# Patient Record
Sex: Female | Born: 1965 | Race: White | Hispanic: No | Smoking: Current every day smoker
Health system: Southern US, Community
[De-identification: ages and names within clinical notes are randomized; demographics above are authoritative.]

## PROBLEM LIST (undated history)

## (undated) DIAGNOSIS — M5030 Other cervical disc degeneration, unspecified cervical region: Secondary | ICD-10-CM

## (undated) DIAGNOSIS — I219 Acute myocardial infarction, unspecified: Secondary | ICD-10-CM

## (undated) DIAGNOSIS — F1290 Cannabis use, unspecified, uncomplicated: Secondary | ICD-10-CM

## (undated) DIAGNOSIS — E785 Hyperlipidemia, unspecified: Secondary | ICD-10-CM

## (undated) DIAGNOSIS — I779 Disorder of arteries and arterioles, unspecified: Secondary | ICD-10-CM

## (undated) DIAGNOSIS — Z720 Tobacco use: Secondary | ICD-10-CM

## (undated) DIAGNOSIS — Z87442 Personal history of urinary calculi: Secondary | ICD-10-CM

## (undated) DIAGNOSIS — M62838 Other muscle spasm: Secondary | ICD-10-CM

## (undated) DIAGNOSIS — I2510 Atherosclerotic heart disease of native coronary artery without angina pectoris: Secondary | ICD-10-CM

## (undated) DIAGNOSIS — I700 Atherosclerosis of aorta: Secondary | ICD-10-CM

## (undated) DIAGNOSIS — E559 Vitamin D deficiency, unspecified: Secondary | ICD-10-CM

## (undated) DIAGNOSIS — G4730 Sleep apnea, unspecified: Secondary | ICD-10-CM

## (undated) DIAGNOSIS — I10 Essential (primary) hypertension: Secondary | ICD-10-CM

## (undated) DIAGNOSIS — Z8616 Personal history of COVID-19: Secondary | ICD-10-CM

## (undated) DIAGNOSIS — R911 Solitary pulmonary nodule: Secondary | ICD-10-CM

## (undated) DIAGNOSIS — Z7902 Long term (current) use of antithrombotics/antiplatelets: Secondary | ICD-10-CM

## (undated) DIAGNOSIS — M419 Scoliosis, unspecified: Secondary | ICD-10-CM

## (undated) DIAGNOSIS — J449 Chronic obstructive pulmonary disease, unspecified: Secondary | ICD-10-CM

## (undated) DIAGNOSIS — R0600 Dyspnea, unspecified: Secondary | ICD-10-CM

## (undated) DIAGNOSIS — F419 Anxiety disorder, unspecified: Secondary | ICD-10-CM

## (undated) HISTORY — PX: KYPHOPLASTY: SHX5884

## (undated) HISTORY — PX: BACK SURGERY: SHX140

---

## 1998-12-25 ENCOUNTER — Other Ambulatory Visit: Admission: RE | Admit: 1998-12-25 | Discharge: 1998-12-25 | Payer: Self-pay | Admitting: *Deleted

## 1999-06-23 ENCOUNTER — Other Ambulatory Visit: Admission: RE | Admit: 1999-06-23 | Discharge: 1999-06-23 | Payer: Self-pay | Admitting: *Deleted

## 1999-07-21 ENCOUNTER — Other Ambulatory Visit: Admission: RE | Admit: 1999-07-21 | Discharge: 1999-07-21 | Payer: Self-pay | Admitting: *Deleted

## 2000-01-28 ENCOUNTER — Inpatient Hospital Stay (HOSPITAL_COMMUNITY): Admission: AD | Admit: 2000-01-28 | Discharge: 2000-01-28 | Payer: Self-pay | Admitting: Obstetrics

## 2000-02-17 ENCOUNTER — Other Ambulatory Visit: Admission: RE | Admit: 2000-02-17 | Discharge: 2000-02-17 | Payer: Self-pay | Admitting: Obstetrics & Gynecology

## 2000-07-28 ENCOUNTER — Inpatient Hospital Stay (HOSPITAL_COMMUNITY): Admission: RE | Admit: 2000-07-28 | Discharge: 2000-07-28 | Payer: Self-pay | Admitting: Obstetrics & Gynecology

## 2000-07-28 ENCOUNTER — Encounter: Payer: Self-pay | Admitting: Obstetrics & Gynecology

## 2000-08-14 ENCOUNTER — Inpatient Hospital Stay (HOSPITAL_COMMUNITY): Admission: AD | Admit: 2000-08-14 | Discharge: 2000-08-14 | Payer: Self-pay | Admitting: Obstetrics & Gynecology

## 2000-08-14 ENCOUNTER — Encounter: Payer: Self-pay | Admitting: Obstetrics & Gynecology

## 2000-08-25 ENCOUNTER — Inpatient Hospital Stay (HOSPITAL_COMMUNITY): Admission: AD | Admit: 2000-08-25 | Discharge: 2000-08-25 | Payer: Self-pay | Admitting: Obstetrics and Gynecology

## 2000-08-25 ENCOUNTER — Encounter: Payer: Self-pay | Admitting: Obstetrics and Gynecology

## 2000-08-28 ENCOUNTER — Inpatient Hospital Stay (HOSPITAL_COMMUNITY): Admission: AD | Admit: 2000-08-28 | Discharge: 2000-08-28 | Payer: Self-pay | Admitting: Obstetrics & Gynecology

## 2000-08-28 ENCOUNTER — Encounter: Payer: Self-pay | Admitting: Obstetrics & Gynecology

## 2000-08-30 ENCOUNTER — Inpatient Hospital Stay (HOSPITAL_COMMUNITY): Admission: AD | Admit: 2000-08-30 | Discharge: 2000-08-30 | Payer: Self-pay | Admitting: Obstetrics and Gynecology

## 2000-09-01 ENCOUNTER — Inpatient Hospital Stay (HOSPITAL_COMMUNITY): Admission: AD | Admit: 2000-09-01 | Discharge: 2000-09-04 | Payer: Self-pay | Admitting: Obstetrics & Gynecology

## 2000-09-06 ENCOUNTER — Inpatient Hospital Stay (HOSPITAL_COMMUNITY): Admission: AD | Admit: 2000-09-06 | Discharge: 2000-09-06 | Payer: Self-pay | Admitting: Obstetrics and Gynecology

## 2000-09-26 ENCOUNTER — Other Ambulatory Visit: Admission: RE | Admit: 2000-09-26 | Discharge: 2000-09-26 | Payer: Self-pay | Admitting: Obstetrics & Gynecology

## 2004-12-11 ENCOUNTER — Inpatient Hospital Stay (HOSPITAL_COMMUNITY): Admission: AD | Admit: 2004-12-11 | Discharge: 2004-12-11 | Payer: Self-pay | Admitting: Gynecology

## 2004-12-22 ENCOUNTER — Encounter: Payer: Self-pay | Admitting: Gynecology

## 2004-12-27 ENCOUNTER — Ambulatory Visit (HOSPITAL_COMMUNITY): Admission: RE | Admit: 2004-12-27 | Discharge: 2004-12-27 | Payer: Self-pay | Admitting: Gynecology

## 2005-09-02 ENCOUNTER — Ambulatory Visit (HOSPITAL_COMMUNITY): Admission: RE | Admit: 2005-09-02 | Discharge: 2005-09-02 | Payer: Self-pay | Admitting: Gynecology

## 2005-10-28 ENCOUNTER — Inpatient Hospital Stay (HOSPITAL_COMMUNITY): Admission: AD | Admit: 2005-10-28 | Discharge: 2005-10-29 | Payer: Self-pay | Admitting: Gynecology

## 2005-10-29 ENCOUNTER — Observation Stay (HOSPITAL_COMMUNITY): Admission: AD | Admit: 2005-10-29 | Discharge: 2005-10-30 | Payer: Self-pay | Admitting: Gynecology

## 2005-12-15 ENCOUNTER — Emergency Department: Payer: Self-pay | Admitting: Emergency Medicine

## 2006-01-23 ENCOUNTER — Ambulatory Visit: Payer: Self-pay | Admitting: Orthopedic Surgery

## 2006-02-17 ENCOUNTER — Ambulatory Visit (HOSPITAL_COMMUNITY): Admission: RE | Admit: 2006-02-17 | Discharge: 2006-02-18 | Payer: Self-pay | Admitting: Neurosurgery

## 2007-08-02 ENCOUNTER — Emergency Department: Payer: Self-pay | Admitting: Emergency Medicine

## 2008-05-02 ENCOUNTER — Emergency Department: Payer: Self-pay | Admitting: Internal Medicine

## 2009-02-11 ENCOUNTER — Emergency Department: Payer: Self-pay | Admitting: Emergency Medicine

## 2010-12-02 ENCOUNTER — Emergency Department: Payer: Self-pay | Admitting: Emergency Medicine

## 2011-03-13 ENCOUNTER — Emergency Department: Payer: Self-pay | Admitting: Emergency Medicine

## 2012-11-04 ENCOUNTER — Inpatient Hospital Stay: Payer: Self-pay | Admitting: Student

## 2012-11-04 LAB — BASIC METABOLIC PANEL
Chloride: 109 mmol/L — ABNORMAL HIGH (ref 98–107)
Co2: 25 mmol/L (ref 21–32)
EGFR (African American): >60
EGFR (Non-African Amer.): >60
Osmolality: 276 (ref 275–301)
Potassium: 3.7 mmol/L (ref 3.5–5.1)
Sodium: 140 mmol/L (ref 136–145)

## 2012-11-04 LAB — CBC
HCT: 44.9 % (ref 35.0–47.0)
HGB: 15.1 g/dL (ref 12.0–16.0)
MCH: 35.9 pg — ABNORMAL HIGH (ref 26.0–34.0)
MCHC: 33.6 g/dL (ref 32.0–36.0)
WBC: 8.5 10*3/uL (ref 3.6–11.0)

## 2012-11-05 LAB — CBC WITH DIFFERENTIAL/PLATELET
Basophil %: 0.3 %
Eosinophil %: 0.1 %
HCT: 39.1 % (ref 35.0–47.0)
Lymphocyte #: 0.4 10*3/uL — ABNORMAL LOW (ref 1.0–3.6)
MCH: 36.5 pg — ABNORMAL HIGH (ref 26.0–34.0)
MCHC: 33.7 g/dL (ref 32.0–36.0)
Monocyte #: 0 x10 3/mm — ABNORMAL LOW (ref 0.2–0.9)
Monocyte %: 0.6 %
Neutrophil %: 88.7 %
RBC: 3.62 10*6/uL — ABNORMAL LOW (ref 3.80–5.20)
RDW: 13.8 % (ref 11.5–14.5)
WBC: 4 10*3/uL (ref 3.6–11.0)

## 2012-11-05 LAB — URINALYSIS, COMPLETE
Bilirubin,UR: NEGATIVE
Nitrite: NEGATIVE
Ph: 6 (ref 4.5–8.0)
Protein: NEGATIVE
RBC,UR: <1 /HPF (ref 0–5)
Specific Gravity: 1.004 (ref 1.003–1.030)
Squamous Epithelial: 2
WBC UR: 1 /HPF (ref 0–5)

## 2012-11-07 LAB — BASIC METABOLIC PANEL
BUN: 7 mg/dL (ref 7–18)
Calcium, Total: 8.9 mg/dL (ref 8.5–10.1)
Co2: 25 mmol/L (ref 21–32)
Creatinine: 0.82 mg/dL (ref 0.60–1.30)
EGFR (Non-African Amer.): >60
Osmolality: 286 (ref 275–301)

## 2012-11-10 LAB — EXPECTORATED SPUTUM ASSESSMENT W GRAM STAIN, RFLX TO RESP C

## 2012-11-10 LAB — CULTURE, BLOOD (SINGLE)

## 2014-07-02 ENCOUNTER — Emergency Department: Payer: Self-pay | Admitting: Emergency Medicine

## 2014-07-02 LAB — BASIC METABOLIC PANEL
Anion Gap: 4 — ABNORMAL LOW (ref 7–16)
BUN: 5 mg/dL — AB (ref 7–18)
CHLORIDE: 110 mmol/L — AB (ref 98–107)
CO2: 24 mmol/L (ref 21–32)
CREATININE: 0.78 mg/dL (ref 0.60–1.30)
Calcium, Total: 8.2 mg/dL — ABNORMAL LOW (ref 8.5–10.1)
Glucose: 114 mg/dL — ABNORMAL HIGH (ref 65–99)
OSMOLALITY: 274 (ref 275–301)
POTASSIUM: 3.3 mmol/L — AB (ref 3.5–5.1)
SODIUM: 138 mmol/L (ref 136–145)

## 2014-07-02 LAB — CBC
HCT: 42.4 % (ref 35.0–47.0)
HGB: 13.4 g/dL (ref 12.0–16.0)
MCH: 31.4 pg (ref 26.0–34.0)
MCHC: 31.5 g/dL — ABNORMAL LOW (ref 32.0–36.0)
MCV: 100 fL (ref 80–100)
Platelet: 238 10*3/uL (ref 150–440)
RBC: 4.25 10*6/uL (ref 3.80–5.20)
RDW: 13.8 % (ref 11.5–14.5)
WBC: 13.8 10*3/uL — ABNORMAL HIGH (ref 3.6–11.0)

## 2014-07-02 LAB — TROPONIN I

## 2015-02-06 NOTE — H&P (Signed)
PATIENT NAME:  Tonya Miller, [name] MR#:  [id number] DATE OF BIRTH:  1966-05-12  DATE OF ADMISSION:  11/04/2012  REFERRING PHYSICIAN: Lurena Joinerebecca L. Shaune PollackLord, MD, primary care physician and Dr. Daphine DeutscherMartin in [place].   CHIEF COMPLAINT: Shortness of breath.   HISTORY OF PRESENT ILLNESS: The patient is a pleasant 49 year old Caucasian female with history of anxiety and depression who is here for shortness of breath, dyspnea on exertion and wheezing for the past week or so. The patient stated she has had no sick contacts. The cough is productive with white sputum. There are no chills or fevers. The patient presented to the hospital and was found to have oxygen 02 saturations in the 88% range and now she is on oxygen. The patient was also noted to have relative hypotension. At one point she had a history of hypertension and was on blood pressure medications, but she was weaned off of it as intraocular pressure was too much. Currently, she is not on any blood pressure medications. She has no fever. X-ray of the chest does not show pneumonia. She has negative rapid flu and hospitalist services were contacted for further evaluation and management.   PAST MEDICAL HISTORY:   Anxiety/depression.  PAST SURGICAL HISTORY:  1.  History of tubal ligation. 2.  D and C and cesarean section. 3.  Back surgery.   CURRENT OUTPATIENT MEDICATIONS:  1.  Citalopram 40 mg daily.  2.  Clonazepam 1 mg 2 times a day.  3.  Fioricet 1 tab daily as needed.   ALLERGIES: None.   FAMILY HISTORY: Dad with a throat cancer, alcoholic.  Mom with the diabetes, hypertension, and coronary artery disease.  REVIEW OF SYSTEMS: CONSTITUTIONAL: No fever or fatigue. No weight changes.  EYES: No blurry vision or double vision.  ENT: No tinnitus or hearing loss. Some sinus tenderness and pain with some drainage.  RESPIRATORY: Positive for cough, wheezing, shortness of breath. No history of asthma or COPD.  CARDIOVASCULAR: No chest pain, orthopnea, or  edema.  GASTROINTESTINAL: No nausea, vomiting, diarrhea, abdominal pain or melena.  GU: Denies dysuria, hematuria, and has no anemia or easy bruising.  SKIN: No new rashes.  MUSCULOSKELETAL: Denies arthritis or gout. PSYCHIATRIC:  History of anxiety and depression.   PHYSICAL EXAMINATION:  VITAL SIGNS: Temperature on arrival 98.4, pulse 73, respiratory rate 18, blood pressure 94/64, lowest oxygen saturation was 88% on room air. Currently, she is a 96% on 2 liters oxygen. The patient also did have a transient hypotension, with pressures as low as 70s/50s, currently in 80s. GENERAL: The patient is Caucasian female laying in bed in no obvious distress talking in full sentences.  HEENT: Normocephalic, atraumatic. Pupils are equal and reactive. Anicteric sclerae. Extraocular muscles intact and moist mucous membranes.  NECK: Supple. No thyroid tenderness. No cervical lymphadenopathy.  CARDIOVASCULAR: S1, S2 regular rate and rhythm. No rubs, or gallops.  LUNGS: Scattered wheezing bilaterally, decreased breath sounds.  ABDOMEN: Soft, nontender, nondistended. Positive bowel sounds in all quadrants.  EXTREMITIES: No significant lower extremity edema.  NEUROLOGICAL: Cranial nerves 2 through 12 are grossly intact. Strength is 5/5 in all extremities. Sensation intact to light touch.  PSYCH: Awake, alert x 3. Pleasant, cooperative, conversant.   DIAGNOSTIC DATA:  Glucose 85, BUN 4, creatinine 0.78, sodium 140, potassium 3.7.  WBC  8.5, hemoglobin 15.1, platelets 215.  Rapid flu negative. X-ray of the chest, not officially read yet but to me does not appear to have any significant infiltrate. EKG sinus rhythm, rate 76.  No acute ST elevations or depressions, nonspecific T-wave changes.   ASSESSMENT AND PLAN: A pleasant 49 year old Caucasian female with anxiety, depression with acute hypoxic respiratory failure, likely secondary to chronic obstructive pulmonary disease exacerbation.  1.  The patient does smoke  tobacco for multiple decades, although has no history of chronic obstructive pulmonary disease or diagnosed chronic obstructive pulmonary disease, or asthma with wheezing and tobacco history, likely has underlying chronic obstructive pulmonary disease. At this point, we would admit the patient to the hospitalist service for respiratory failure.  She did have oxygen sats of 88% on room air, currently better with oxygen. Would start her on steroids IV, as well as nebulizers standing and azithromycin. She does not have any significant infiltrate on x-ray of the chest. Also started on Spiriva and oxygen and keep saturations between 90 to 94%.    2.  With regards to tobacco abuse, would start her on nicotine patch and she was counseled for three minutes.  3.  In regards to the hypotension, we would continue IV fluid boluses.  While I was in the room the patient has had no significant IV fluid resuscitation thus far. She is mentating fine, awake, alert, oriented x 3 and is not tachycardic. We will see how she does with IV fluids.  4.  We will continue her outpatient anxiety/depression medications. 5.  Start her on heparin for deep vein thrombosis prophylaxis.  6.  CODE STATUS: The patient is full code.   Total Time Spent: 55 minutes.   ____________________________ Krystal Eaton, MD sa:eg D: 11/04/2012 21:39:01 ET T: 11/04/2012 21:56:32 ET JOB#: [id number]  cc: Krystal Eaton, MD, <Dictator> Randon Goldsmith. Shaune Pollack, MD Krystal Eaton MD ELECTRONICALLY SIGNED 11/22/2012 20:05

## 2015-02-06 NOTE — Discharge Summary (Signed)
PATIENT NAME:  Tonya Miller, STONEHOCKER MR#:  [id number] DATE OF BIRTH:  1966-08-30  DATE OF ADMISSION:  11/04/2012 DATE OF DISCHARGE:  11/09/2012  CHIEF COMPLAINT:  Shortness of breath.   DISCHARGE DIAGNOSES: 1.  Acute respiratory failure, likely in the setting of chronic obstructive pulmonary disease exacerbation.  2.  History of chronic obstructive pulmonary disease, likely undiagnosed yet.  3.  Tobacco abuse.  4.  Anxiety.  5.  Depression.  6.  Hypokalemia.   DISCHARGE MEDICATIONS:   1.  Citalopram 40 mg daily.   2.  Clonazepam 1 mg 3 times a day.   3.  Fioricet 2 tabs every 4 hours as needed for severe migraine.  4.  Tiotropium 18 mcg one cap daily inhaled.  5.  Nicotine patch one patch transdermal at 21 mg per 24 hour extended-release film.  6.  Prednisone taper 30 mg for two days, then 20 mg for two days, then 10 mg for two days, then stop. 7.  Symbicort 80/4.5 mcg 2 puffs 2 times a day.  8.  Azithromycin 500 mg daily for one day.  9.  Albuterol CFC free 90 mcg inhaled 2 puffs 4 times a day as needed for wheezing.   DIET:  Low sodium.   ACTIVITY:  As tolerated.   FOLLOWUP:  Please follow with primary care physician within 1 to 2 weeks.  Please follow with a lung doctor i.e. pulmonologist for pulmonary function tests in about 2 to 4 weeks.   DISPOSITION:  Home.   HISTORY OF PRESENT ILLNESS AND HOSPITAL COURSE:  For full details of history and physical, please see the dictation by Dr. Jacques Navy on January 19, but briefly this is a pleasant 49 year old female with history of anxiety, depression, and a long time tobacco abuse who presented with a cough with whitish sputum without chills or fevers and was found to have O2 sats of 88% and was placed on oxygen in the ER.  X-ray of the chest was negative for pneumonia and she had rapid flu test which was negative as well.  She was started on azithromycin, nebulizers, inhalers including Spiriva and Symbicort and admitted to the hospitalist  service.  The patient had no significant leukocytosis nor fever.  She had blood cultures which have been negative so far from January 19.  Sputum cultures were sent on January 21st and have come back so far with light growth gram-negative rods and there are some staph aureus as well.  Although she was placed on isolation, her symptoms have much improved.  Although she was up to 30% FiO2 of high flow nasal cannula, she has been completely off of oxygen.  Her cough is better.  Her symptoms are better and the patient has remained afebrile and her lungs are clear.  Her bronchitis and COPD flare has been treated adequately.  At this point she has been transitioned into by mouth steroids from IV steroids.  She will be discharged with an additional day of azithromycin.  She likely did have COPD exacerbation with bronchitis.  She needs a formal diagnosis of that COPD with PFTs.  She was counseled about tobacco abuse and was placed on a nicotine patch and will be discharged on a nicotine patch.  The patient did have some vomiting and diarrhea which have improved.  Clostridium difficile was negative.  Her anxiety and depression medications were continued.  At this point as her symptoms are better she will be discharged with outpatient follow-up.   TOTAL TIME SPENT:  Thirty-five minutes.   CODE STATUS:  PATIENT IS FULL CODE.     ____________________________ Krystal EatonShayiq Mehdi Gironda, MD sa:ea D: 11/09/2012 15:36:00 ET T: 11/10/2012 06:29:09 ET JOB#: [id number]  cc: Krystal EatonShayiq Nessie Nong, MD, <Dictator> Krystal EatonSHAYIQ Dalexa Gentz MD ELECTRONICALLY SIGNED 11/22/2012 20:06

## 2015-02-10 ENCOUNTER — Ambulatory Visit
Admit: 2015-02-10 | Disposition: A | Discharge status: Home / Self Care | Payer: Self-pay | Attending: Family Medicine | Admitting: Family Medicine

## 2015-10-04 ENCOUNTER — Emergency Department
Admission: EM | Admit: 2015-10-04 | Discharge: 2015-10-04 | Disposition: A | Discharge status: Home / Self Care | Payer: BLUE CROSS/BLUE SHIELD | Attending: Emergency Medicine | Admitting: Emergency Medicine

## 2015-10-04 ENCOUNTER — Encounter: Payer: Self-pay | Admitting: *Deleted

## 2015-10-04 DIAGNOSIS — R251 Tremor, unspecified: Secondary | ICD-10-CM | POA: Insufficient documentation

## 2015-10-04 DIAGNOSIS — F17200 Nicotine dependence, unspecified, uncomplicated: Secondary | ICD-10-CM | POA: Insufficient documentation

## 2015-10-04 DIAGNOSIS — M79606 Pain in leg, unspecified: Secondary | ICD-10-CM

## 2015-10-04 HISTORY — DX: Other muscle spasm: M62.838

## 2015-10-04 LAB — CBC WITH DIFFERENTIAL/PLATELET
Basophils Absolute: 0.1 10*3/uL (ref 0–0.1)
Basophils Relative: 1 %
EOS ABS: 0.1 10*3/uL (ref 0–0.7)
Eosinophils Relative: 0 %
HCT: 45 % (ref 35.0–47.0)
Hemoglobin: 14.7 g/dL (ref 12.0–16.0)
Lymphocytes Relative: 34 %
Lymphs Abs: 5.1 10*3/uL — ABNORMAL HIGH (ref 1.0–3.6)
MCH: 31 pg (ref 26.0–34.0)
MCHC: 32.7 g/dL (ref 32.0–36.0)
MCV: 94.8 fL (ref 80.0–100.0)
MONO ABS: 0.9 10*3/uL (ref 0.2–0.9)
MONOS PCT: 6 %
Neutro Abs: 8.7 10*3/uL — ABNORMAL HIGH (ref 1.4–6.5)
Neutrophils Relative %: 59 %
PLATELETS: 255 10*3/uL (ref 150–440)
RBC: 4.75 MIL/uL (ref 3.80–5.20)
RDW: 13.3 % (ref 11.5–14.5)
WBC: 14.9 10*3/uL — ABNORMAL HIGH (ref 3.6–11.0)

## 2015-10-04 LAB — BASIC METABOLIC PANEL
Anion gap: 9 (ref 5–15)
CALCIUM: 9.3 mg/dL (ref 8.9–10.3)
CO2: 25 mmol/L (ref 22–32)
CREATININE: 0.66 mg/dL (ref 0.44–1.00)
Chloride: 105 mmol/L (ref 101–111)
GFR calc Af Amer: >60 mL/min (ref >60)
GLUCOSE: 115 mg/dL — AB (ref 65–99)
POTASSIUM: 3.1 mmol/L — AB (ref 3.5–5.1)
SODIUM: 139 mmol/L (ref 135–145)

## 2015-10-04 MED ORDER — PREGABALIN 75 MG PO CAPS: 75.0000 mg | ORAL_CAPSULE | Freq: Two times a day (BID) | ORAL | Status: DC

## 2015-10-04 MED ORDER — POTASSIUM CHLORIDE CRYS ER 20 MEQ PO TBCR
40.0000 meq | EXTENDED_RELEASE_TABLET | Freq: Once | ORAL | Status: AC
  Administered 2015-10-04: 40 meq via ORAL
  Filled 2015-10-04: qty 2

## 2015-10-04 NOTE — Discharge Instructions (Signed)
Please seek medical attention for any high fevers, chest pain, shortness of breath, change in behavior, persistent vomiting, bloody stool or any other new or concerning symptoms. ° ° °Pain Without a Known Cause °WHAT IS PAIN WITHOUT A KNOWN CAUSE? °Pain can occur in any part of the body and can range from mild to severe. Sometimes no cause can be found for why you are having pain. Some types of pain that can occur without a known cause include:  °· Headache. °· Back pain. °· Abdominal pain. °· Neck pain. °HOW IS PAIN WITHOUT A KNOWN CAUSE DIAGNOSED?  °Your health care provider will try to find the cause of your pain. This may include: °· Physical exam. °· Medical history. °· Blood tests. °· Urine tests. °· X-rays. °If no cause is found, your health care provider may diagnose you with pain without a known cause.  °IS THERE TREATMENT FOR PAIN WITHOUT A CAUSE?  °Treatment depends on the kind of pain you have. Your health care provider may prescribe medicines to help relieve your pain.  °WHAT CAN I DO AT HOME FOR MY PAIN?  °· Take medicines only as directed by your health care provider. °· Stop any activities that cause pain. During periods of severe pain, bed rest may help. °· Try to reduce your stress with activities such as yoga or meditation. Talk to your health care provider for other stress-reducing activity recommendations. °· Exercise regularly, if approved by your health care provider. °· Eat a healthy diet that includes fruits and vegetables. This may improve pain. Talk to your health care provider if you have any questions about your diet. °WHAT IF MY PAIN DOES NOT GET BETTER?  °If you have a painful condition and no reason can be found for the pain or the pain gets worse, it is important to follow up with your health care provider. It may be necessary to repeat tests and look further for a possible cause.  °  °This information is not intended to replace advice given to you by your health care provider. Make  sure you discuss any questions you have with your health care provider. °  °Document Released: 06/28/2001 Document Revised: 10/24/2014 Document Reviewed: 02/18/2014 °Elsevier Interactive Patient Education ©2016 [employer] ° °

## 2015-10-04 NOTE — ED Provider Notes (Signed)
[facility] Emergency Department Provider Note   ____________________________________________  Time seen: 2210  I have reviewed the triage vital signs and the nursing notes.   HISTORY  Chief Complaint Leg Pain   History limited by: Not Limited   HPI Tonya Miller is a 49 y.o. female who presents to the emergency department today because of leg pain and tremors. She states it is been going on for roughly 1 month. She states it is gradually and slowly gotten worse over that time. Is located bilateral legs. She states it is roughly from the knees down. She has been seen by her primary care doctor for workup for this condition. She states the primary care doctor told to go to the emergency department if it was to get worse. She has not seen a neurologist for this. She denies any neurologic issues and her family. Denies any recent trauma to her head.   Past Medical History  Diagnosis Date  . Muscle spasm     There are no active problems to display for this patient.   Past Surgical History  Procedure Laterality Date  . Cesarean section    . Back surgery      kyphoplasty    No current outpatient prescriptions on file.  Allergies Review of patient's allergies indicates no known allergies.  No family history on file.  Social History Social History  Substance Use Topics  . Smoking status: Current Every Day Smoker  . Smokeless tobacco: None  . Alcohol Use: No    Review of Systems  Constitutional: Negative for fever. Cardiovascular: Negative for chest pain. Respiratory: Negative for shortness of breath. Gastrointestinal: Negative for abdominal pain, vomiting and diarrhea. Neurological: Negative for headaches, focal weakness or numbness.  10-point ROS otherwise negative.  ____________________________________________   PHYSICAL EXAM:  VITAL SIGNS: ED Triage Vitals  Enc Vitals Group     BP 10/04/15 1851 156/88 mmHg     Pulse Rate 10/04/15  1851 74     Resp 10/04/15 1851 22     Temp 10/04/15 1851 98.2 F (36.8 C)     Temp Source 10/04/15 1851 Oral     SpO2 10/04/15 1851 91 %     Weight 10/04/15 1851 150 lb (68.04 kg)     Height 10/04/15 1851  (1.549 m)     Head Cir --      Peak Flow --      Pain Score 10/04/15 1851 10   Constitutional: Alert and oriented. Well appearing and in no distress. Eyes: Conjunctivae are normal. PERRL. Normal extraocular movements. ENT   Head: Normocephalic and atraumatic.   Nose: No congestion/rhinnorhea.   Mouth/Throat: Mucous membranes are moist.   Neck: No stridor. Hematological/Lymphatic/Immunilogical: No cervical lymphadenopathy. Cardiovascular: Normal rate, regular rhythm.  No murmurs, rubs, or gallops. Respiratory: Normal respiratory effort without tachypnea nor retractions. Breath sounds are clear and equal bilaterally. No wheezes/rales/rhonchi. Gastrointestinal: Soft and nontender. No distention.  Genitourinary: Deferred Musculoskeletal: Normal range of motion in all extremities. No joint effusions.  No lower extremity tenderness nor edema. Neurologic:  Normal speech and language. No gross focal neurologic deficits are appreciated. Tremors in bilateral legs. Strength 5/5 in upper and lower extremities.  Skin:  Skin is warm, dry and intact. No rash noted. Psychiatric: Mood and affect are normal. Speech and behavior are normal. Patient exhibits appropriate insight and judgment.  ____________________________________________    LABS (pertinent positives/negatives)  Labs Reviewed  CBC WITH DIFFERENTIAL/PLATELET - Abnormal; Notable for the following:  WBC 14.9 (*)    Neutro Abs 8.7 (*)    Lymphs Abs 5.1 (*)    All other components within normal limits  BASIC METABOLIC PANEL - Abnormal; Notable for the following:    Potassium 3.1 (*)    Glucose, Bld 115 (*)    BUN <5 (*)    All other components within normal limits      ____________________________________________   EKG  None  ____________________________________________    RADIOLOGY  None  ____________________________________________   PROCEDURES  Procedure(s) performed: None  Critical Care performed: No  ____________________________________________   INITIAL IMPRESSION / ASSESSMENT AND PLAN / ED COURSE  Pertinent labs & imaging results that were available during my care of the patient were reviewed by me and considered in my medical decision making (see chart for details).  Patient here because of leg tremors and pain. On exam she does have bilateral leg tremors. Blood work with slightly low potassium however doubt this is the cause of the symptoms, however will give potassium here. Will refer to neurology.  ____________________________________________   FINAL CLINICAL IMPRESSION(S) / ED DIAGNOSES  Final diagnoses:  Pain of lower extremity, unspecified laterality     Phineas SemenGraydon Amiri Tritch, MD 10/04/15 2310

## 2015-10-04 NOTE — ED Notes (Signed)
Patient c/o tremors in both legs and chronic back pain. Patient has seen PMD and had ultrasound to rule out DVT and medications for back spasms. Patient states she was negative for DVT. Patient reports it's painful to walk and walking has become more difficult. Patient states PMD referred her to ED if pain worsened. Patient states pain and tremors have worsened today.

## 2017-09-05 ENCOUNTER — Emergency Department (HOSPITAL_COMMUNITY): Payer: Medicaid Other

## 2017-09-05 ENCOUNTER — Observation Stay (HOSPITAL_COMMUNITY)
Admission: EM | Admit: 2017-09-05 | Discharge: 2017-09-06 | Disposition: A | Discharge status: Hospice | Payer: Medicaid Other | Attending: Neurological Surgery | Admitting: Neurological Surgery

## 2017-09-05 DIAGNOSIS — I700 Atherosclerosis of aorta: Secondary | ICD-10-CM | POA: Insufficient documentation

## 2017-09-05 DIAGNOSIS — J342 Deviated nasal septum: Secondary | ICD-10-CM | POA: Diagnosis not present

## 2017-09-05 DIAGNOSIS — H538 Other visual disturbances: Secondary | ICD-10-CM | POA: Diagnosis not present

## 2017-09-05 DIAGNOSIS — I708 Atherosclerosis of other arteries: Secondary | ICD-10-CM | POA: Insufficient documentation

## 2017-09-05 DIAGNOSIS — Z23 Encounter for immunization: Secondary | ICD-10-CM | POA: Insufficient documentation

## 2017-09-05 DIAGNOSIS — S0101XA Laceration without foreign body of scalp, initial encounter: Secondary | ICD-10-CM

## 2017-09-05 DIAGNOSIS — R531 Weakness: Secondary | ICD-10-CM | POA: Insufficient documentation

## 2017-09-05 DIAGNOSIS — M4856XD Collapsed vertebra, not elsewhere classified, lumbar region, subsequent encounter for fracture with routine healing: Secondary | ICD-10-CM | POA: Diagnosis not present

## 2017-09-05 DIAGNOSIS — M545 Low back pain: Secondary | ICD-10-CM | POA: Insufficient documentation

## 2017-09-05 DIAGNOSIS — Y998 Other external cause status: Secondary | ICD-10-CM | POA: Diagnosis not present

## 2017-09-05 DIAGNOSIS — S066X0A Traumatic subarachnoid hemorrhage without loss of consciousness, initial encounter: Principal | ICD-10-CM | POA: Insufficient documentation

## 2017-09-05 DIAGNOSIS — Y92410 Unspecified street and highway as the place of occurrence of the external cause: Secondary | ICD-10-CM | POA: Insufficient documentation

## 2017-09-05 DIAGNOSIS — M47812 Spondylosis without myelopathy or radiculopathy, cervical region: Secondary | ICD-10-CM | POA: Diagnosis not present

## 2017-09-05 DIAGNOSIS — Y9389 Activity, other specified: Secondary | ICD-10-CM | POA: Diagnosis not present

## 2017-09-05 DIAGNOSIS — R413 Other amnesia: Secondary | ICD-10-CM | POA: Diagnosis not present

## 2017-09-05 DIAGNOSIS — S0003XA Contusion of scalp, initial encounter: Secondary | ICD-10-CM | POA: Diagnosis not present

## 2017-09-05 DIAGNOSIS — Z79899 Other long term (current) drug therapy: Secondary | ICD-10-CM | POA: Insufficient documentation

## 2017-09-05 DIAGNOSIS — G8929 Other chronic pain: Secondary | ICD-10-CM | POA: Insufficient documentation

## 2017-09-05 DIAGNOSIS — I350 Nonrheumatic aortic (valve) stenosis: Secondary | ICD-10-CM | POA: Insufficient documentation

## 2017-09-05 DIAGNOSIS — I609 Nontraumatic subarachnoid hemorrhage, unspecified: Secondary | ICD-10-CM

## 2017-09-05 DIAGNOSIS — J432 Centrilobular emphysema: Secondary | ICD-10-CM | POA: Diagnosis not present

## 2017-09-05 DIAGNOSIS — T1490XA Injury, unspecified, initial encounter: Secondary | ICD-10-CM

## 2017-09-05 HISTORY — DX: Anxiety disorder, unspecified: F41.9

## 2017-09-05 HISTORY — DX: Chronic obstructive pulmonary disease, unspecified: J44.9

## 2017-09-05 HISTORY — DX: Nontraumatic subarachnoid hemorrhage, unspecified: I60.9

## 2017-09-05 LAB — URINALYSIS, ROUTINE W REFLEX MICROSCOPIC
Bacteria, UA: NONE SEEN
Bilirubin Urine: NEGATIVE
Glucose, UA: NEGATIVE mg/dL
Ketones, ur: NEGATIVE mg/dL
Nitrite: NEGATIVE
Protein, ur: NEGATIVE mg/dL
Specific Gravity, Urine: 1.024 (ref 1.005–1.030)
Squamous Epithelial / HPF: NONE SEEN
pH: 6 (ref 5.0–8.0)

## 2017-09-05 LAB — RAPID URINE DRUG SCREEN, HOSP PERFORMED
Amphetamines: NOT DETECTED
Barbiturates: NOT DETECTED
Benzodiazepines: NOT DETECTED
Cocaine: NOT DETECTED
Opiates: POSITIVE — AB
Tetrahydrocannabinol: NOT DETECTED

## 2017-09-05 LAB — COMPREHENSIVE METABOLIC PANEL
ALK PHOS: 75 U/L (ref 38–126)
ALT: 6 U/L — AB (ref 14–54)
AST: 22 U/L (ref 15–41)
Albumin: 3.3 g/dL — ABNORMAL LOW (ref 3.5–5.0)
Anion gap: 11 (ref 5–15)
BILIRUBIN TOTAL: 1.3 mg/dL — AB (ref 0.3–1.2)
BUN: <5 mg/dL — ABNORMAL LOW (ref 6–20)
CALCIUM: 8.8 mg/dL — AB (ref 8.9–10.3)
CO2: 22 mmol/L (ref 22–32)
CREATININE: 0.85 mg/dL (ref 0.44–1.00)
Chloride: 108 mmol/L (ref 101–111)
Glucose, Bld: 130 mg/dL — ABNORMAL HIGH (ref 65–99)
Potassium: 3.9 mmol/L (ref 3.5–5.1)
Sodium: 141 mmol/L (ref 135–145)
TOTAL PROTEIN: 6.3 g/dL — AB (ref 6.5–8.1)

## 2017-09-05 LAB — I-STAT CHEM 8, ED
BUN: <3 mg/dL — ABNORMAL LOW (ref 6–20)
CREATININE: 0.8 mg/dL (ref 0.44–1.00)
Calcium, Ion: 1.11 mmol/L — ABNORMAL LOW (ref 1.15–1.40)
Chloride: 106 mmol/L (ref 101–111)
GLUCOSE: 133 mg/dL — AB (ref 65–99)
HCT: 49 % — ABNORMAL HIGH (ref 36.0–46.0)
Hemoglobin: 16.7 g/dL — ABNORMAL HIGH (ref 12.0–15.0)
POTASSIUM: 3.8 mmol/L (ref 3.5–5.1)
Sodium: 142 mmol/L (ref 135–145)
TCO2: 29 mmol/L (ref 22–32)

## 2017-09-05 LAB — CBC
HCT: 50 % — ABNORMAL HIGH (ref 36.0–46.0)
Hemoglobin: 16.7 g/dL — ABNORMAL HIGH (ref 12.0–15.0)
MCH: 35.1 pg — ABNORMAL HIGH (ref 26.0–34.0)
MCHC: 33.4 g/dL (ref 30.0–36.0)
MCV: 105 fL — ABNORMAL HIGH (ref 78.0–100.0)
PLATELETS: 189 10*3/uL (ref 150–400)
RBC: 4.76 MIL/uL (ref 3.87–5.11)
RDW: 14 % (ref 11.5–15.5)
WBC: 10.2 10*3/uL (ref 4.0–10.5)

## 2017-09-05 LAB — SAMPLE TO BLOOD BANK

## 2017-09-05 LAB — I-STAT CG4 LACTIC ACID, ED: Lactic Acid, Venous: 2.24 mmol/L (ref 0.5–1.9)

## 2017-09-05 LAB — ETHANOL

## 2017-09-05 LAB — PROTIME-INR
INR: 0.98
Prothrombin Time: 12.9 seconds (ref 11.4–15.2)

## 2017-09-05 LAB — I-STAT BETA HCG BLOOD, ED (MC, WL, AP ONLY)

## 2017-09-05 LAB — CDS SEROLOGY

## 2017-09-05 MED ORDER — SODIUM CHLORIDE 0.9 % IV SOLN
INTRAVENOUS | Status: DC
  Administered 2017-09-06: 05:00:00 via INTRAVENOUS

## 2017-09-05 MED ORDER — LIDOCAINE-EPINEPHRINE (PF) 2 %-1:200000 IJ SOLN
20.0000 mL | Freq: Once | INTRAMUSCULAR | Status: DC
  Filled 2017-09-05: qty 20

## 2017-09-05 MED ORDER — HYDROMORPHONE HCL 1 MG/ML IJ SOLN
INTRAMUSCULAR | Status: AC | PRN
  Administered 2017-09-05 (×2): 1 mg via INTRAVENOUS

## 2017-09-05 MED ORDER — FENTANYL CITRATE (PF) 100 MCG/2ML IJ SOLN
100.0000 ug | INTRAMUSCULAR | Status: AC | PRN
  Administered 2017-09-05 – 2017-09-06 (×2): 100 ug via INTRAVENOUS
  Filled 2017-09-05 (×2): qty 2

## 2017-09-05 MED ORDER — MAGNESIUM CITRATE PO SOLN: 1.0000 | Freq: Once | ORAL | Status: DC | PRN

## 2017-09-05 MED ORDER — SODIUM CHLORIDE 0.9% FLUSH
3.0000 mL | Freq: Two times a day (BID) | INTRAVENOUS | Status: DC
  Administered 2017-09-05 – 2017-09-06 (×2): 3 mL via INTRAVENOUS

## 2017-09-05 MED ORDER — ONDANSETRON HCL 4 MG/2ML IJ SOLN
INTRAMUSCULAR | Status: AC
  Filled 2017-09-05: qty 2

## 2017-09-05 MED ORDER — ACETAMINOPHEN 325 MG PO TABS: 650.0000 mg | ORAL_TABLET | Freq: Four times a day (QID) | ORAL | Status: DC | PRN

## 2017-09-05 MED ORDER — IOPAMIDOL (ISOVUE-300) INJECTION 61%
100.0000 mL | Freq: Once | INTRAVENOUS | Status: AC | PRN
  Administered 2017-09-05: 100 mL via INTRAVENOUS

## 2017-09-05 MED ORDER — HYDROMORPHONE HCL 1 MG/ML IJ SOLN
INTRAMUSCULAR | Status: AC
  Filled 2017-09-05: qty 1

## 2017-09-05 MED ORDER — SODIUM CHLORIDE 0.9% FLUSH: 3.0000 mL | INTRAVENOUS | Status: DC | PRN

## 2017-09-05 MED ORDER — HYDROMORPHONE HCL 1 MG/ML IJ SOLN: 1.0000 mg | Freq: Once | INTRAMUSCULAR | Status: DC

## 2017-09-05 MED ORDER — HYDROMORPHONE HCL 1 MG/ML IJ SOLN
1.0000 mg | Freq: Once | INTRAMUSCULAR | Status: DC
  Filled 2017-09-05: qty 1

## 2017-09-05 MED ORDER — CLONAZEPAM 0.5 MG PO TABS: 0.5000 mg | ORAL_TABLET | Freq: Three times a day (TID) | ORAL | Status: DC | PRN

## 2017-09-05 MED ORDER — GABAPENTIN 300 MG PO CAPS
300.0000 mg | ORAL_CAPSULE | Freq: Three times a day (TID) | ORAL | Status: DC
  Administered 2017-09-05 – 2017-09-06 (×3): 300 mg via ORAL
  Filled 2017-09-05 (×3): qty 1

## 2017-09-05 MED ORDER — SODIUM CHLORIDE 0.9 % IV SOLN
500.0000 mg | Freq: Two times a day (BID) | INTRAVENOUS | Status: DC
  Administered 2017-09-05 – 2017-09-06 (×2): 500 mg via INTRAVENOUS
  Filled 2017-09-05 (×3): qty 5

## 2017-09-05 MED ORDER — SODIUM CHLORIDE 0.9 % IV SOLN: 250.0000 mL | INTRAVENOUS | Status: DC | PRN

## 2017-09-05 MED ORDER — FENTANYL CITRATE (PF) 100 MCG/2ML IJ SOLN
INTRAMUSCULAR | Status: AC | PRN
  Administered 2017-09-05: 50 ug via INTRAVENOUS

## 2017-09-05 MED ORDER — HYDROCODONE-ACETAMINOPHEN 5-325 MG PO TABS
1.0000 | ORAL_TABLET | ORAL | Status: DC | PRN
  Administered 2017-09-06 (×2): 1 via ORAL
  Filled 2017-09-05 (×2): qty 1

## 2017-09-05 MED ORDER — HYDROMORPHONE HCL 1 MG/ML IJ SOLN
1.0000 mg | INTRAMUSCULAR | Status: DC | PRN
Start: 2017-09-05 — End: 2017-09-06
  Administered 2017-09-05 – 2017-09-06 (×2): 1 mg via INTRAVENOUS
  Filled 2017-09-05 (×2): qty 1

## 2017-09-05 MED ORDER — ONDANSETRON HCL 4 MG PO TABS
4.0000 mg | ORAL_TABLET | Freq: Four times a day (QID) | ORAL | Status: DC | PRN
  Administered 2017-09-06: 4 mg via ORAL
  Filled 2017-09-05: qty 1

## 2017-09-05 MED ORDER — SENNOSIDES-DOCUSATE SODIUM 8.6-50 MG PO TABS: 1.0000 | ORAL_TABLET | Freq: Every evening | ORAL | Status: DC | PRN

## 2017-09-05 MED ORDER — BISACODYL 5 MG PO TBEC: 5.0000 mg | DELAYED_RELEASE_TABLET | Freq: Every day | ORAL | Status: DC | PRN

## 2017-09-05 MED ORDER — ONDANSETRON HCL 4 MG/2ML IJ SOLN: 4.0000 mg | Freq: Four times a day (QID) | INTRAMUSCULAR | Status: DC | PRN

## 2017-09-05 MED ORDER — TETANUS-DIPHTH-ACELL PERTUSSIS 5-2.5-18.5 LF-MCG/0.5 IM SUSP
0.5000 mL | Freq: Once | INTRAMUSCULAR | Status: AC
  Administered 2017-09-05: 0.5 mL via INTRAMUSCULAR
  Filled 2017-09-05: qty 0.5

## 2017-09-05 MED ORDER — SENNA 8.6 MG PO TABS
1.0000 | ORAL_TABLET | Freq: Two times a day (BID) | ORAL | Status: DC
  Administered 2017-09-06: 8.6 mg via ORAL
  Filled 2017-09-05 (×3): qty 1

## 2017-09-05 MED ORDER — SODIUM CHLORIDE 0.9 % IV SOLN
INTRAVENOUS | Status: AC | PRN
  Administered 2017-09-05: 1000 mL via INTRAVENOUS

## 2017-09-05 MED ORDER — ACETAMINOPHEN 650 MG RE SUPP: 650.0000 mg | Freq: Four times a day (QID) | RECTAL | Status: DC | PRN

## 2017-09-05 MED ORDER — SODIUM CHLORIDE 0.9 % IV BOLUS (SEPSIS): 1000.0000 mL | Freq: Once | INTRAVENOUS | Status: AC
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
## 2017-09-05 NOTE — ED Provider Notes (Signed)
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 details:    Dressing:  Antibiotic ointment   Patient tolerance of procedure:  Tolerated well, no immediate complications .Marland KitchenLaceration Repair Date/Time: 09/05/2017 3:18 PM Performed by: Pricilla Loveless, MD Authorized by: Pricilla Loveless, MD   Consent:    Consent obtained:  Verbal   Consent given by:  Patient Anesthesia (see MAR for exact dosages):    Anesthesia method:  Local infiltration   Local anesthetic:  Lidocaine 2% WITH epi Laceration details:    Location:  Scalp   Scalp location:  Frontal   Length (cm):  1 Repair type:    Repair type:  Simple Pre-procedure details:    Preparation:  Patient was prepped and draped in usual sterile fashion and imaging obtained to evaluate for foreign bodies Exploration:    Hemostasis achieved with:  Direct pressure   Wound exploration: entire depth of wound probed and visualized   Treatment:    Area cleansed with:  Saline and soap and water   Amount of cleaning:  Standard   Irrigation solution:  Sterile water Skin repair:    Repair method:  Sutures   Suture size:  5-0   Suture material:  Prolene   Suture  technique:  Simple interrupted   Number of sutures:  2 Approximation:    Approximation:  Close   Vermilion border: well-aligned   Post-procedure details:    Dressing:  Antibiotic ointment   Patient tolerance of procedure:  Tolerated well, no immediate complications .Critical Care Performed by: Pricilla Loveless, MD Authorized by: Pricilla Loveless, MD   Critical care provider statement:    Critical care time (minutes):  35   Critical care was necessary to treat or prevent imminent or life-threatening deterioration of the following conditions:  CNS failure or compromise and trauma   Critical care was time spent personally by me on the following activities:  Development of treatment plan with patient or surrogate, discussions with consultants, evaluation of patient's response to treatment, examination of patient, obtaining history from patient or surrogate, ordering and performing treatments and interventions, ordering and review of laboratory studies, ordering and review of radiographic studies, pulse oximetry, re-evaluation of patient's condition and review of old charts   (including critical care time)  Medications Ordered in ED Medications  fentaNYL (SUBLIMAZE) injection 100 mcg (not administered)  HYDROmorphone (DILAUDID) injection 1 mg (not administered)  lidocaine-EPINEPHrine (XYLOCAINE W/EPI) 2 %-1:200000 (PF) injection 20 mL (not administered)  HYDROmorphone (DILAUDID) 1 MG/ML injection (not administered)  ondansetron (ZOFRAN) 4 MG/2ML injection (not administered)  HYDROmorphone (DILAUDID) injection 1 mg (not administered)  levETIRAcetam (KEPPRA) 500 mg in sodium chloride 0.9 % 100 mL IVPB (not administered)  sodium chloride 0.9 % bolus 1,000 mL (not administered)  sodium chloride flush (NS) 0.9 % injection 3 mL (not administered)  sodium chloride flush (NS) 0.9 %  injection 3 mL (not administered)  0.9 %  sodium chloride infusion (not administered)  0.9 %  sodium chloride infusion  (not administered)  acetaminophen (TYLENOL) tablet 650 mg (not administered)    Or  acetaminophen (TYLENOL) suppository 650 mg (not administered)  HYDROcodone-acetaminophen (NORCO/VICODIN) 5-325 MG per tablet 1-2 tablet (not administered)  senna (SENOKOT) tablet 8.6 mg (not administered)  senna-docusate (Senokot-S) tablet 1 tablet (not administered)  bisacodyl (DULCOLAX) EC tablet 5 mg (not administered)  magnesium citrate solution 1 Bottle (not administered)  ondansetron (ZOFRAN) tablet 4 mg (not administered)    Or  ondansetron (ZOFRAN) injection 4 mg (not administered)  HYDROmorphone (DILAUDID) injection 1 mg (not administered)  gabapentin (NEURONTIN) capsule 300 mg (not administered)  clonazePAM (KLONOPIN) tablet 0.5 mg (not administered)  Tdap (BOOSTRIX) injection 0.5 mL (0.5 mLs Intramuscular Given 09/05/17 1415)  iopamidol (ISOVUE-300) 61 % injection 100 mL (100 mLs Intravenous Contrast Given 09/05/17 1339)  fentaNYL (SUBLIMAZE) injection (50 mcg Intravenous Given 09/05/17 1340)  HYDROmorphone (DILAUDID) injection (1 mg Intravenous Given 09/05/17 1506)  0.9 %  sodium chloride infusion (1,000 mLs Intravenous New Bag/Given 09/05/17 1553)     Initial Impression / Assessment and Plan / ED Course  I have reviewed the triage vital signs and the nursing notes.  Pertinent labs & imaging results that were available during my care of the patient were reviewed by me and considered in my medical decision making (see chart for details).     Patient presents as level 2 trauma. Small SAH, but no mass effect or obvious cause of diffuse extremity weakness.  Exam is inconsistent.  At first she was unable to really move her extremities at all.  She has had gradually more movement but cannot seem to lift her extremities off the bed.  However when they are lifted off the bed they gradually go down.  Neurosurgery was consulted and based on her findings they will get an MRI.  She will be admitted to  their service for observation and repeat head CT as well.  Her lacerations were repaired and she was given tetanus immunization.  She has had some soft blood pressures but no hypotension.  She has no sources of bleeding besides the now not bleeding scalp wounds.  Admit to the neurosurgery service.  Final Clinical Impressions(s) / ED Diagnoses   Final diagnoses:  SAH (subarachnoid hemorrhage) (HCC)  Laceration of scalp, initial encounter    ED Discharge Orders    None       Pricilla Loveless, MD 09/05/17 1623

## 2017-09-05 NOTE — ED Notes (Signed)
Pt unable to pick arms up , pt will hold arms up and keep them up once they are held up , pt unable to pick legs up but pull knees up
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
## 2017-09-06 ENCOUNTER — Observation Stay (HOSPITAL_COMMUNITY): Payer: Medicaid Other

## 2017-09-06 ENCOUNTER — Encounter (HOSPITAL_COMMUNITY): Payer: Self-pay | Admitting: *Deleted

## 2017-09-06 LAB — MRSA PCR SCREENING: MRSA BY PCR: NEGATIVE

## 2017-09-06 LAB — HIV ANTIBODY (ROUTINE TESTING W REFLEX): HIV Screen 4th Generation wRfx: NONREACTIVE

## 2017-09-06 MED ORDER — LEVETIRACETAM 500 MG PO TABS: 500.0000 mg | ORAL_TABLET | Freq: Two times a day (BID) | ORAL | 0 refills | Status: DC

## 2017-09-06 MED ORDER — METHOCARBAMOL 750 MG PO TABS: 750.0000 mg | ORAL_TABLET | Freq: Four times a day (QID) | ORAL | 2 refills | Status: DC | PRN

## 2017-09-06 MED ORDER — DICLOFENAC SODIUM 75 MG PO TBEC: 75.0000 mg | DELAYED_RELEASE_TABLET | Freq: Two times a day (BID) | ORAL | 2 refills | Status: DC

## 2017-09-06 MED ORDER — METHOCARBAMOL 500 MG PO TABS
750.0000 mg | ORAL_TABLET | Freq: Four times a day (QID) | ORAL | Status: DC
  Administered 2017-09-06: 750 mg via ORAL
  Filled 2017-09-06: qty 2
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
## 2017-09-06 NOTE — Progress Notes (Signed)
Neurosurgery Progress Note  No issues overnight. UE movement is improving. Generalized muscle aches and headache.  EXAM:  BP 97/64   Pulse 62   Temp 98.4 F (36.9 C) (Axillary)   Resp 12   Ht 5\' 1"  (1.549 m)   Wt 67.6 kg (149 lb 0.5 oz)   LMP  (LMP Unknown)   SpO2 98%   BMI 28.16 kg/m   Awake, alert, oriented  Speech fluent, appropriate  Motor exam remains inconsistent: Will squeeze with bilateral hands 3/5 and raise forearms off bed able to flex at shoulders 30 degrees. Will shrug shoulders.  When lifting her arm at the shoulder off the bed and asking her to hold it, she will against gravity.  Will move BLE on command - strength 3/5.   PLAN  Neurological inconsistency: Motor exam improving, although still inconsistent. MRI C spine normal. No clear explanation for symptoms. Reviewed with attending, Dr Sharlet SalinaBenjamin Ditty, will continue to monitor.  Order placed for Ot/PT  SAH Stable Complete 7 day keppra course No need for repeat head CT unless exam changes

## 2017-09-06 NOTE — Progress Notes (Signed)
SATURATION QUALIFICATIONS: (This note is used to comply with regulatory documentation for home oxygen)  Patient Saturations on Room Air at Rest = 79%  Patient Saturations on Room Air while Ambulating = 84%  Patient Saturations on 3 Liters of oxygen while Ambulating = 91%  Please briefly explain why patient needs home oxygen: Patient is unable to maintain SpO2>89% at rest or ambulating. She is able to maintain safe vital signs on supplemental oxygen.   7:07 PM, 09/06/17 Tonya LintsAllan C Diasia Henken, PT, DPT Relief Physical Therapist - [place] [phone] (Pager)  [phone] [facility](Mobile)  [phone] (Office)

## 2017-09-08 ENCOUNTER — Encounter: Payer: Self-pay | Admitting: *Deleted

## 2018-03-01 ENCOUNTER — Encounter: Payer: Self-pay | Admitting: Internal Medicine

## 2018-03-01 ENCOUNTER — Ambulatory Visit: Payer: BLUE CROSS/BLUE SHIELD | Admitting: Internal Medicine

## 2018-03-01 VITALS — BP 118/76 | HR 91 | Ht 61.0 in | Wt 139.0 lb

## 2018-03-01 DIAGNOSIS — R400 Somnolence: Secondary | ICD-10-CM | POA: Diagnosis not present

## 2018-03-01 DIAGNOSIS — F17210 Nicotine dependence, cigarettes, uncomplicated: Secondary | ICD-10-CM | POA: Diagnosis not present

## 2018-03-01 DIAGNOSIS — J449 Chronic obstructive pulmonary disease, unspecified: Secondary | ICD-10-CM

## 2018-03-01 NOTE — Progress Notes (Signed)
Name: Tonya Miller MRN: [id number] DOB: Apr 07, 1966     CONSULTATION DATE: 5.16.19 REFERRING MD : wiliams  CHIEF COMPLAINT: I have sleep problems  STUDIES:     11.18.18 CXR independently reviewed by Me +interstitial edema    HISTORY OF PRESENT ILLNESS: 52 yo pleasant white female assessed today for sleep related issues  Patient  has been having sleep problems for over one year Patient has been having excessive daytime sleepiness Patient has been having extreme fatigue and tiredness, lack of energy +  very Loud snoring every night + struggling breathe at night and gasps for air    EPWORTH SLEEP SCORE 11   Patient also has a history of COPD diagnosed several years ago patient has chronic intermittent shortness of breath with exertional dyspnea patient has intermittent cough and wheezing with URIs however no active wheezing and no signs of infection at this time  Patient still smokes 1/2 pack/day however she is has smoked 1 pack/day for the last 25 years She is trying to cut down  Patient states she is on inhaled steroids at home but her medication list is not up-to-date and will provide that information when she gets home She uses albuterol as needed  Patient also has been prescribed oxygen for the past 3 years however she is not compliant I have suggested that she is compliant with her oxygen therapy  No signs of infection at this time No signs of acute heart failure at this time      Smoking Assessment and Cessation Counseling   Upon further questioning, Patient smokes 1 ppd  I have advised patient to quit/stop smoking as soon as possible due to high risk for multiple medical problems  Patient is willing to quit smoking   I have advised patient that we can assist and have options of Nicotine replacement therapy. I also advised patient on behavioral therapy and can provide oral medication therapy in conjunction with the other therapies  Follow up next  Office visit  for assessment of smoking cessation  Smoking cessation counseling advised for 4 minutes    PAST MEDICAL HISTORY :   has a past medical history of Anxiety, COPD (chronic obstructive pulmonary disease) (HCC), and Muscle spasm.  has a past surgical history that includes Cesarean section; Back surgery; and Back surgery.   H/o SAH Prior to Admission medications   Medication Sig Start Date End Date Taking? Authorizing Provider  albuterol (PROAIR HFA) 108 (90 Base) MCG/ACT inhaler Inhale 2 puffs into the lungs every 6 (six) hours as needed for wheezing or shortness of breath.    [provider]  Butalbital-Acetaminophen 50-300 MG CAPS Take 1 capsule by mouth See admin instructions. 1 tablet every 6-8 hours as needed for headaches    [provider]  clonazePAM (KLONOPIN) 0.5 MG tablet Take 0.5 mg by mouth See admin instructions. 0.5 mg two to three times a day as needed for anxiety    [provider]  diclofenac (VOLTAREN) 75 MG EC tablet Take 1 tablet (75 mg total) by mouth 2 (two) times daily. 09/06/17   Ditty, Loura Halt, MD  gabapentin (NEURONTIN) 300 MG capsule Take 300 mg by mouth 3 (three) times daily.    [provider]  levETIRAcetam (KEPPRA) 500 MG tablet Take 1 tablet (500 mg total) by mouth 2 (two) times daily. 09/06/17   Ditty, Loura Halt, MD  medroxyPROGESTERone (PROVERA) 2.5 MG tablet Take 2.5 mg by mouth daily.    [provider]  methocarbamol (ROBAXIN) 750 MG tablet Take 1 tablet (750 mg total) by mouth every 6 (six) hours as needed (muscle pain or spasm). 09/06/17   Ditty, Loura Halt, MD  methocarbamol (ROBAXIN-750) 750 MG tablet Take 1 tablet (750 mg total) by mouth every 6 (six) hours as needed (muscle pain and spasm). 09/06/17   Ditty, Loura Halt, MD  pregabalin (LYRICA) 75 MG capsule Take 1 capsule (75 mg total) by mouth 2 (two) times daily. 10/04/15 10/03/16  Phineas Semen, MD   No Known  Allergies  FAMILY HISTORY:  +HTN SOCIAL HISTORY:  reports that she has been smoking cigarettes.  She has been smoking about 1.00 pack per day. She has never used smokeless tobacco. She reports that she does not drink alcohol or use drugs.  REVIEW OF SYSTEMS:   Constitutional: Negative for fever, chills, weight loss, malaise/fatigue and diaphoresis.  HENT: Negative for hearing loss, ear pain, nosebleeds, congestion, sore throat, neck pain, tinnitus and ear discharge.   Eyes: Negative for blurred vision, double vision, photophobia, pain, discharge and redness.  Respiratory: Negative for cough, hemoptysis, sputum production, +shortness of breath, -wheezing and stridor.   Cardiovascular: Negative for chest pain, palpitations, orthopnea, claudication, leg swelling and PND.  Gastrointestinal: Negative for heartburn, nausea, vomiting, abdominal pain, diarrhea, constipation, blood in stool and melena.  Genitourinary: Negative for dysuria, urgency, frequency, hematuria and flank pain.  Musculoskeletal: Negative for myalgias, back pain, joint pain and falls.  Skin: Negative for itching and rash.  Neurological: Negative for dizziness, tingling, tremors, sensory change, speech change, focal weakness, seizures, loss of consciousness, weakness and headaches.  Endo/Heme/Allergies: Negative for environmental allergies and polydipsia. Does not bruise/bleed easily.  ALL OTHER ROS ARE NEGATIVE   BP 118/76 (BP Location: Left Arm, Cuff Size: Normal)   Pulse 91   Ht  (1.549 m)   Wt 139 lb (63 kg)   LMP  (LMP Unknown)   SpO2 100%   BMI 26.26 kg/m   Physical Examination:   GENERAL:NAD, no fevers, chills, no weakness no fatigue HEAD: Normocephalic, atraumatic.  EYES: Pupils equal, round, reactive to light. Extraocular muscles intact. No scleral icterus.  MOUTH: Moist mucosal membrane.   EAR, NOSE, THROAT: Clear without exudates. No external lesions.  NECK: Supple. No thyromegaly. No nodules. No  JVD.  PULMONARY:CTA B/L no wheezes, no crackles, no rhonchi CARDIOVASCULAR: S1 and S2. Regular rate and rhythm. No murmurs, rubs, or gallops. No edema.  GASTROINTESTINAL: Soft, nontender, nondistended. No masses. Positive bowel sounds.  MUSCULOSKELETAL: No swelling, clubbing, or edema. Range of motion full in all extremities.  NEUROLOGIC: Cranial nerves II through XII are intact. No gross focal neurological deficits.  SKIN: No ulceration, lesions, rashes, or cyanosis. Skin warm and dry. Turgor intact.  PSYCHIATRIC: Mood, affect within normal limits. The patient is awake, alert and oriented x 3. Insight, judgment intact.      ASSESSMENT / PLAN: 52 year old pleasant white female seen today for assessment for her COPD and also with excessive daytime sleepiness and fatigue with a probable history of underlying sleep apnea in the setting of ongoing tobacco abuse and a deconditioned state  #1 shortness of breath dyspnea on exertion multifactorial related to deconditioned state and COPD  #2 COPD Gold stage A Patient will need full set of PFTs for further assessment I have advised patient to continue and inhalers as prescribed Patient is to call us back with her specific inhaler she uses at home Continue to use albuterol as needed Smoking cessation advised  #3  signs and symptoms of excessive daytime sleepiness Patient will need sleep study to confirm and rule out sleep apnea Split-night sleep study ordered  #4 deconditioned state Recommend exercise as tolerated   #5 smoking cessation strongly advised    Patient satisfied with Plan of action and management. All questions answered Follow-up after tests completed   Lucie Leather, M.D.  [facility]  Medical Director [facility] Medical Director [facility] Cardio-Pulmonary Department

## 2018-03-01 NOTE — Patient Instructions (Signed)
Obtain Sleep Study Obtain PFT's  STOP SMOKING!!  Continue inhalers as prescribed

## 2018-03-02 ENCOUNTER — Ambulatory Visit: Payer: Medicaid Other | Attending: Internal Medicine

## 2018-03-02 DIAGNOSIS — G4719 Other hypersomnia: Secondary | ICD-10-CM | POA: Diagnosis present

## 2018-03-02 DIAGNOSIS — G4761 Periodic limb movement disorder: Secondary | ICD-10-CM | POA: Diagnosis not present

## 2018-03-02 DIAGNOSIS — G4733 Obstructive sleep apnea (adult) (pediatric): Secondary | ICD-10-CM | POA: Diagnosis not present

## 2018-03-09 ENCOUNTER — Telehealth: Payer: Self-pay | Admitting: Internal Medicine

## 2018-03-09 DIAGNOSIS — G4733 Obstructive sleep apnea (adult) (pediatric): Secondary | ICD-10-CM | POA: Diagnosis not present

## 2018-03-09 NOTE — Telephone Encounter (Signed)
Please call with sleep study results. STaets she has a 10:30 break and a 12 noon break.

## 2018-03-09 NOTE — Telephone Encounter (Signed)
Pt is aware that I will get results from Ram as he reads sleep studies for Kasa. Can call back to patient with results at 3pm today-this is her next break at work.    Ram, please advise. Thanks.

## 2018-03-13 NOTE — Telephone Encounter (Signed)
Patient calling for results of sleep study    °

## 2018-03-13 NOTE — Telephone Encounter (Signed)
[email or web address] 

## 2018-03-13 NOTE — Telephone Encounter (Signed)
Patient calling to check status of sleep study results Also is in need of a new mask for sleep machine - would like to know where to inquire for this States since it is hard to get a hold of her it is ok to leave a message Please call

## 2018-03-13 NOTE — Telephone Encounter (Signed)
Pt call asking for sleep results. Do you know when you will be reading sleep study results?

## 2018-03-14 NOTE — Telephone Encounter (Signed)
Pt aware titration study needed. Pt is currently with [facility] but would like to change DME when she gets new machine. Titration study ordered. She will wait on supplies until she gets new machine.

## 2018-03-19 ENCOUNTER — Ambulatory Visit: Payer: Medicaid Other | Attending: Internal Medicine

## 2018-03-19 DIAGNOSIS — G4761 Periodic limb movement disorder: Secondary | ICD-10-CM | POA: Diagnosis not present

## 2018-03-19 DIAGNOSIS — G4733 Obstructive sleep apnea (adult) (pediatric): Secondary | ICD-10-CM | POA: Insufficient documentation

## 2018-03-23 DIAGNOSIS — G4733 Obstructive sleep apnea (adult) (pediatric): Secondary | ICD-10-CM | POA: Diagnosis not present

## 2018-03-28 ENCOUNTER — Telehealth: Payer: Self-pay | Admitting: *Deleted

## 2018-03-28 DIAGNOSIS — J449 Chronic obstructive pulmonary disease, unspecified: Secondary | ICD-10-CM

## 2018-03-28 DIAGNOSIS — G4733 Obstructive sleep apnea (adult) (pediatric): Secondary | ICD-10-CM

## 2018-03-28 NOTE — Telephone Encounter (Signed)
Pt aware Orders placed Nothing further needed. 

## 2018-03-28 NOTE — Addendum Note (Signed)
Addended by: Janean SarkSNIPES, Leslie Jester K on: 03/28/2018 02:48 PM   Modules accepted: Orders

## 2018-04-13 ENCOUNTER — Telehealth: Payer: Self-pay | Admitting: Internal Medicine

## 2018-04-13 NOTE — Telephone Encounter (Signed)
Spoke with patient and she stated that she hasn't heard anything about her set up with [facility]. Called [facility] and left message on VM as to status of set up.  Waiting on return call. Rhonda J Cobb

## 2018-04-13 NOTE — Telephone Encounter (Signed)
Spoke with Lupita LeashDonna at [facility] and she states that order was submitted to pt's insurance on 04/04/18 which is still in the pending status.  Per Lupita Leashonna [employer] Corporationpt's insurance usually takes anywhere from 10-45 days to approve CPAP. Lupita LeashDonna suggested that patient contact insurance as to status and advise that she needs this equipment and this may speed up process.  Pt advised to contact insurance. Pt voiced understanding and nothing else needed at this time. Rhonda J Cobb

## 2018-04-13 NOTE — Telephone Encounter (Signed)
Please call regarding her CPAP machine. Pt would not relay any other information. Please call. Pt states she takes breaks at 10:30, 12 noon, and 3

## 2018-05-24 ENCOUNTER — Ambulatory Visit: Payer: Medicaid Other | Attending: Internal Medicine

## 2018-05-24 ENCOUNTER — Telehealth: Payer: Self-pay | Admitting: Internal Medicine

## 2018-05-24 NOTE — Telephone Encounter (Signed)
Tonya Miller with [facility] lab calling needing to know if we were going to do patient upcoming 6 minute walk test.  Patient is currently scheduled for their office today at 2:30 pm  Please advise

## 2018-05-24 NOTE — Telephone Encounter (Signed)
Returned call to Amy. Dr. Belia HemanKasa did mean to order SMW with PFT on this patient.
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
## 2019-09-03 ENCOUNTER — Other Ambulatory Visit: Payer: Self-pay

## 2019-09-03 DIAGNOSIS — Z20822 Contact with and (suspected) exposure to covid-19: Secondary | ICD-10-CM

## 2019-09-05 LAB — NOVEL CORONAVIRUS, NAA: SARS-CoV-2, NAA: NOT DETECTED

## 2020-03-13 ENCOUNTER — Emergency Department: Payer: Medicaid Other

## 2020-03-13 ENCOUNTER — Other Ambulatory Visit: Payer: Self-pay

## 2020-03-13 ENCOUNTER — Emergency Department
Admission: EM | Admit: 2020-03-13 | Discharge: 2020-03-13 | Disposition: A | Discharge status: Home / Self Care | Payer: Medicaid Other | Attending: Emergency Medicine | Admitting: Emergency Medicine

## 2020-03-13 DIAGNOSIS — J449 Chronic obstructive pulmonary disease, unspecified: Secondary | ICD-10-CM | POA: Insufficient documentation

## 2020-03-13 DIAGNOSIS — R05 Cough: Secondary | ICD-10-CM | POA: Insufficient documentation

## 2020-03-13 DIAGNOSIS — R0981 Nasal congestion: Secondary | ICD-10-CM | POA: Insufficient documentation

## 2020-03-13 DIAGNOSIS — J069 Acute upper respiratory infection, unspecified: Secondary | ICD-10-CM | POA: Insufficient documentation

## 2020-03-13 DIAGNOSIS — F17210 Nicotine dependence, cigarettes, uncomplicated: Secondary | ICD-10-CM | POA: Insufficient documentation

## 2020-03-13 DIAGNOSIS — Z20822 Contact with and (suspected) exposure to covid-19: Secondary | ICD-10-CM | POA: Insufficient documentation

## 2020-03-13 LAB — SARS CORONAVIRUS 2 BY RT PCR (HOSPITAL ORDER, PERFORMED IN ~~LOC~~ HOSPITAL LAB): SARS Coronavirus 2: NEGATIVE

## 2020-03-13 MED ORDER — BENZONATATE 100 MG PO CAPS
100.0000 mg | ORAL_CAPSULE | Freq: Three times a day (TID) | ORAL | 0 refills | Status: AC | PRN
Start: 2020-03-13 — End: 2021-03-13

## 2020-03-13 MED ORDER — CETIRIZINE HCL 10 MG PO TABS
10.0000 mg | ORAL_TABLET | Freq: Every day | ORAL | 0 refills | Status: DC
Start: 2020-03-13 — End: 2021-07-29

## 2020-03-13 MED ORDER — AZITHROMYCIN 250 MG PO TABS: ORAL_TABLET | ORAL | 0 refills | Status: DC

## 2020-03-13 MED ORDER — FLUTICASONE PROPIONATE 50 MCG/ACT NA SUSP: 2.0000 | Freq: Every day | NASAL | 0 refills | Status: DC

## 2020-03-13 NOTE — ED Provider Notes (Signed)
[facility] Emergency Department Provider Note  ____________________________________________  Time seen: Approximately 10:40 AM  I have reviewed the triage vital signs and the nursing notes.   HISTORY  Chief Complaint Hearing Loss and Sore Throat    HPI Tonya Miller is a 54 y.o. female that presents to the emergency department for evaluation of bilateral ear fullness, nasal congestion, scratchy throat for 5 days.  Patient states that nonproductive cough has been for several weeks as well.She states that overall she feels well.  She does not feel bad.  When she was at work this morning, she noticed she was having difficulty hearing out of both of her ears.  They feel "full." She has a history of seasonal allergies.   She has a history of COPD.  She smokes cigarettes daily.   Tessalon perles have helped her in the past. No fever, headache, neck pain, shortness of breath, chest pain, nausea, vomiting, abdominal pain.   Past Medical History:  Diagnosis Date  . Anxiety   . COPD (chronic obstructive pulmonary disease) ([place])   . Muscle spasm     Patient Active Problem List   Diagnosis Date Noted  . Subarachnoid hemorrhage ([place]) 09/05/2017    Past Surgical History:  Procedure Laterality Date  . BACK SURGERY     kyphoplasty  . BACK SURGERY    . CESAREAN SECTION      Prior to Admission medications   Medication Sig Start Date End Date Taking? Authorizing Provider  albuterol (PROAIR HFA) 108 (90 Base) MCG/ACT inhaler Inhale 2 puffs into the lungs every 6 (six) hours as needed for wheezing or shortness of breath.    [provider]  azithromycin (ZITHROMAX Z-PAK) 250 MG tablet Take 2 tablets (500 mg) on  Day 1,  followed by 1 tablet (250 mg) once daily on Days 2 through 5. 03/13/20   Laban Emperor, PA-C  benzonatate (TESSALON PERLES) 100 MG capsule Take 1 capsule (100 mg total) by mouth 3 (three) times daily as needed. 03/13/20 03/13/21  Laban Emperor,  PA-C  Butalbital-Acetaminophen 50-300 MG CAPS Take 1 capsule by mouth See admin instructions. 1 tablet every 6-8 hours as needed for headaches    [provider]  cetirizine (ZYRTEC ALLERGY) 10 MG tablet Take 1 tablet (10 mg total) by mouth daily. 03/13/20 03/13/21  Laban Emperor, PA-C  clonazePAM (KLONOPIN) 0.5 MG tablet Take 0.5 mg by mouth See admin instructions. 0.5 mg two to three times a day as needed for anxiety    [provider]  diclofenac (VOLTAREN) 75 MG EC tablet Take 1 tablet (75 mg total) by mouth 2 (two) times daily. 09/06/17   Ditty, Kevan Ny, MD  fluticasone (FLONASE) 50 MCG/ACT nasal spray Place 2 sprays into both nostrils daily. 03/13/20 03/13/21  Laban Emperor, PA-C  gabapentin (NEURONTIN) 300 MG capsule Take 300 mg by mouth 3 (three) times daily.    [provider]  levETIRAcetam (KEPPRA) 500 MG tablet Take 1 tablet (500 mg total) by mouth 2 (two) times daily. 09/06/17   Ditty, Kevan Ny, MD  medroxyPROGESTERone (PROVERA) 2.5 MG tablet Take 2.5 mg by mouth daily.    [provider]  methocarbamol (ROBAXIN) 750 MG tablet Take 1 tablet (750 mg total) by mouth every 6 (six) hours as needed (muscle pain or spasm). 09/06/17   Ditty, Kevan Ny, MD  methocarbamol (ROBAXIN-750) 750 MG tablet Take 1 tablet (750 mg total) by mouth every 6 (six) hours as needed (muscle pain and  spasm). 09/06/17   Ditty, Loura Halt, MD  pregabalin (LYRICA) 75 MG capsule Take 1 capsule (75 mg total) by mouth 2 (two) times daily. 10/04/15 10/03/16  Phineas Semen, MD    Allergies Patient has no known allergies.  No family history on file.  Social History Social History   Tobacco Use  . Smoking status: Current Every Day Smoker    Packs/day: 1.00    Types: Cigarettes  . Smokeless tobacco: Never Used  Substance Use Topics  . Alcohol use: No  . Drug use: No     Review of Systems  Constitutional: No fever/chills ENT: Positive for nasal  congestion and scratchy throat. Cardiovascular: No chest pain. Respiratory: Positive for non productive cough. No SOB. Gastrointestinal: No abdominal pain.  No nausea, no vomiting.  Musculoskeletal: Negative for musculoskeletal pain. Skin: Negative for rash, abrasions, lacerations, ecchymosis. Neurological: Negative for headaches, numbness or tingling   ____________________________________________   PHYSICAL EXAM:  VITAL SIGNS: ED Triage Vitals [03/13/20 0946]  Enc Vitals Group     BP (!) 132/94     Pulse Rate 81     Resp 18     Temp 98.3 F (36.8 C)     Temp Source Oral     SpO2 96 %     Weight 130 lb (59 kg)     Height 5\' 1"  (1.549 m)     Head Circumference      Peak Flow      Pain Score 0     Pain Loc      Pain Edu?      Excl. in GC?      Constitutional: Alert and oriented. Well appearing and in no acute distress. Eyes: Conjunctivae are normal. PERRL. EOMI. Head: Atraumatic. ENT:      Ears: Tympanic membranes are pink and bulging.      Nose: Positive for congestion.      Mouth/Throat: Mucous membranes are moist.  Oropharynx erythematous.  Tonsils not enlarged.  Uvula midline. Neck: No stridor.   Cardiovascular: Normal rate, regular rhythm.  Good peripheral circulation. Respiratory: Normal respiratory effort without tachypnea or retractions. Lungs CTAB. Good air entry to the bases with no decreased or absent breath sounds. Gastrointestinal: Bowel sounds 4 quadrants. Soft and nontender to palpation. No guarding or rigidity. No palpable masses. No distention.  Musculoskeletal: Full range of motion to all extremities. No gross deformities appreciated. Neurologic:  Normal speech and language. No gross focal neurologic deficits are appreciated.  Skin:  Skin is warm, dry and intact. No rash noted. Psychiatric: Mood and affect are normal. Speech and behavior are normal. Patient exhibits appropriate insight and judgement.   ____________________________________________    LABS (all labs ordered are listed, but only abnormal results are displayed)  Labs Reviewed  SARS CORONAVIRUS 2 BY RT PCR (HOSPITAL ORDER, PERFORMED IN [facility])   ____________________________________________  EKG   ____________________________________________  RADIOLOGY , personally viewed and evaluated these images (plain radiographs) as part of my medical decision making, as well as reviewing the written report by the radiologist.  DG Chest 1 View  Result Date: 03/13/2020 CLINICAL DATA:  Cough. EXAM: CHEST  1 VIEW COMPARISON:  09/05/2017 FINDINGS: 1039 hours. The lungs are clear without focal pneumonia, edema, pneumothorax or pleural effusion. Interstitial markings are diffusely coarsened with chronic features. The cardiopericardial silhouette is within normal limits for size. The visualized bony structures of the thorax are intact. IMPRESSION: Diffuse interstitial prominence without focal consolidation or overt edema.  Infectious/inflammatory etiology not excluded. Electronically Signed   By: Kennith Center M.D.   On: 03/13/2020 10:51    ____________________________________________    PROCEDURES  Procedure(s) performed:    Procedures    Medications - No data to display   ____________________________________________   INITIAL IMPRESSION / ASSESSMENT AND PLAN / ED COURSE  Pertinent labs & imaging results that were available during my care of the patient were reviewed by me and considered in my medical decision making (see chart for details).  Review of the [place] CSRS was performed in accordance of the [facility] prior to dispensing any controlled drugs.   Patient's diagnosis is consistent with URI.  Vital signs and exam are reassuring.  Chest x-ray consistent with chronic changes.  Covid test is pending.  Patient denies any shortness of breath or chest pain.  Patient will be discharged home with prescriptions for azithromycin, Flonase, Tessalon  Perles, Zyrtec. Patient is to follow up with primary care as directed. Patient is given ED precautions to return to the ED for any worsening or new symptoms.   LISEL SIEGRIST was evaluated in Emergency Department on 03/13/2020 for the symptoms described in the history of present illness. She was evaluated in the context of the global COVID-19 pandemic, which necessitated consideration that the patient might be at risk for infection with the SARS-CoV-2 virus that causes COVID-19. Institutional protocols and algorithms that pertain to the evaluation of patients at risk for COVID-19 are in a state of rapid change based on information released by regulatory bodies including the CDC and federal and state organizations. These policies and algorithms were followed during the patient's care in the ED.  ____________________________________________  FINAL CLINICAL IMPRESSION(S) / ED DIAGNOSES  Final diagnoses:  Upper respiratory tract infection, unspecified type      NEW MEDICATIONS STARTED DURING THIS VISIT:  ED Discharge Orders         Ordered    azithromycin (ZITHROMAX Z-PAK) 250 MG tablet     03/13/20 1141    benzonatate (TESSALON PERLES) 100 MG capsule  3 times daily PRN     03/13/20 1141    fluticasone (FLONASE) 50 MCG/ACT nasal spray  Daily     03/13/20 1141    cetirizine (ZYRTEC ALLERGY) 10 MG tablet  Daily     03/13/20 1141              This chart was dictated using voice recognition software/Dragon. Despite best efforts to proofread, errors can occur which can change the meaning. Any change was purely unintentional.    Enid Derry, PA-C 03/13/20 1503    Arnaldo Natal, MD 03/13/20 [phone]

## 2020-03-13 NOTE — ED Triage Notes (Signed)
Pt states that she has had a scratchy throat for the past couple weeks that has caused her to cough, states that this am her ears clogged up and now she can't hear, denies feeling poorly, states that she attempted to clean them with a q-tip but didn't get any wax out

## 2020-03-13 NOTE — ED Notes (Signed)
See triage note  Presents with runny nose and scratchy throat for couple of days  Then this am noticed decreased hearing to left ear

## 2020-07-01 ENCOUNTER — Telehealth: Payer: Self-pay | Admitting: Oncology

## 2020-07-01 NOTE — Telephone Encounter (Signed)
Called to Discuss with patient about Covid symptoms and the use of the monoclonal antibody infusion for those with mild to moderate Covid symptoms and at a high risk of hospitalization.     Pt is qualified for this infusion due to co-morbid conditions and/or a member of an at-risk group.     Patient Active Problem List   Diagnosis Date Noted  . Subarachnoid hemorrhage (HCC) 09/05/2017    Patient declines infusion at this time. Symptoms tier reviewed as well as criteria for ending isolation. Preventative practices reviewed. Patient verbalized understanding.   She is scheduled at [facility] for her MAB infusion for tomorrow 07/02/2020.   Patient advised to call back if he/she decides that he/she does want to get infusion. Callback number to the infusion center given. Patient advised to go to Urgent care or ED with severe symptoms.   Durenda Hurt, NP 07/01/2020 1:53 PM

## 2021-07-28 ENCOUNTER — Inpatient Hospital Stay (HOSPITAL_COMMUNITY)
Admission: EM | Admit: 2021-07-28 | Discharge: 2021-07-31 | DRG: 247 | Disposition: A | Discharge status: Home / Self Care | Payer: 59 | Attending: Cardiovascular Disease | Admitting: Cardiovascular Disease

## 2021-07-28 ENCOUNTER — Encounter (HOSPITAL_COMMUNITY): Payer: Self-pay | Admitting: Emergency Medicine

## 2021-07-28 ENCOUNTER — Emergency Department (HOSPITAL_COMMUNITY): Payer: 59

## 2021-07-28 ENCOUNTER — Encounter (HOSPITAL_COMMUNITY)
Admission: EM | Disposition: A | Discharge status: Home / Self Care | Payer: Self-pay | Source: Home / Self Care | Attending: Cardiovascular Disease

## 2021-07-28 DIAGNOSIS — Z8249 Family history of ischemic heart disease and other diseases of the circulatory system: Secondary | ICD-10-CM

## 2021-07-28 DIAGNOSIS — E785 Hyperlipidemia, unspecified: Secondary | ICD-10-CM | POA: Diagnosis present

## 2021-07-28 DIAGNOSIS — F17210 Nicotine dependence, cigarettes, uncomplicated: Secondary | ICD-10-CM | POA: Diagnosis present

## 2021-07-28 DIAGNOSIS — I214 Non-ST elevation (NSTEMI) myocardial infarction: Secondary | ICD-10-CM

## 2021-07-28 DIAGNOSIS — Z720 Tobacco use: Secondary | ICD-10-CM

## 2021-07-28 DIAGNOSIS — J449 Chronic obstructive pulmonary disease, unspecified: Secondary | ICD-10-CM | POA: Diagnosis present

## 2021-07-28 DIAGNOSIS — I2510 Atherosclerotic heart disease of native coronary artery without angina pectoris: Secondary | ICD-10-CM | POA: Diagnosis present

## 2021-07-28 DIAGNOSIS — I25118 Atherosclerotic heart disease of native coronary artery with other forms of angina pectoris: Secondary | ICD-10-CM

## 2021-07-28 DIAGNOSIS — Z20822 Contact with and (suspected) exposure to covid-19: Secondary | ICD-10-CM | POA: Diagnosis present

## 2021-07-28 DIAGNOSIS — Z955 Presence of coronary angioplasty implant and graft: Secondary | ICD-10-CM

## 2021-07-28 DIAGNOSIS — Z79890 Hormone replacement therapy (postmenopausal): Secondary | ICD-10-CM | POA: Diagnosis not present

## 2021-07-28 DIAGNOSIS — R079 Chest pain, unspecified: Secondary | ICD-10-CM | POA: Diagnosis not present

## 2021-07-28 DIAGNOSIS — R911 Solitary pulmonary nodule: Secondary | ICD-10-CM

## 2021-07-28 HISTORY — DX: Non-ST elevation (NSTEMI) myocardial infarction: I21.4

## 2021-07-28 HISTORY — PX: CORONARY STENT INTERVENTION: CATH118234

## 2021-07-28 HISTORY — PX: LEFT HEART CATH AND CORONARY ANGIOGRAPHY: CATH118249

## 2021-07-28 LAB — RESP PANEL BY RT-PCR (FLU A&B, COVID) ARPGX2
Influenza A by PCR: NEGATIVE
Influenza B by PCR: NEGATIVE
SARS Coronavirus 2 by RT PCR: NEGATIVE

## 2021-07-28 LAB — I-STAT BETA HCG BLOOD, ED (MC, WL, AP ONLY): I-stat hCG, quantitative: <5 m[IU]/mL (ref <5)

## 2021-07-28 LAB — BASIC METABOLIC PANEL
Anion gap: 10 (ref 5–15)
BUN: 8 mg/dL (ref 6–20)
CO2: 23 mmol/L (ref 22–32)
Calcium: 8.8 mg/dL — ABNORMAL LOW (ref 8.9–10.3)
Chloride: 105 mmol/L (ref 98–111)
Creatinine, Ser: 0.86 mg/dL (ref 0.44–1.00)
GFR, Estimated: >60 mL/min (ref >60)
Glucose, Bld: 103 mg/dL — ABNORMAL HIGH (ref 70–99)
Potassium: 4.4 mmol/L (ref 3.5–5.1)
Sodium: 138 mmol/L (ref 135–145)

## 2021-07-28 LAB — CBG MONITORING, ED: Glucose-Capillary: 91 mg/dL (ref 70–99)

## 2021-07-28 LAB — TROPONIN I (HIGH SENSITIVITY)
Troponin I (High Sensitivity): 1367 ng/L (ref <18)
Troponin I (High Sensitivity): 1609 ng/L (ref <18)

## 2021-07-28 LAB — CBC
HCT: 44.5 % (ref 36.0–46.0)
Hemoglobin: 14.1 g/dL (ref 12.0–15.0)
MCH: 32.9 pg (ref 26.0–34.0)
MCHC: 31.7 g/dL (ref 30.0–36.0)
MCV: 103.7 fL — ABNORMAL HIGH (ref 80.0–100.0)
Platelets: 223 10*3/uL (ref 150–400)
RBC: 4.29 MIL/uL (ref 3.87–5.11)
RDW: 12.9 % (ref 11.5–15.5)
WBC: 9.7 10*3/uL (ref 4.0–10.5)
nRBC: 0 % (ref 0.0–0.2)

## 2021-07-28 LAB — POCT ACTIVATED CLOTTING TIME
Activated Clotting Time: 271 seconds
Activated Clotting Time: 352 seconds

## 2021-07-28 SURGERY — LEFT HEART CATH AND CORONARY ANGIOGRAPHY
Anesthesia: LOCAL

## 2021-07-28 MED ORDER — TICAGRELOR 90 MG PO TABS
ORAL_TABLET | ORAL | Status: DC | PRN
  Administered 2021-07-28: 180 mg via ORAL

## 2021-07-28 MED ORDER — TICAGRELOR 90 MG PO TABS
ORAL_TABLET | ORAL | Status: AC
  Filled 2021-07-28: qty 1

## 2021-07-28 MED ORDER — NITROGLYCERIN 1 MG/10 ML FOR IR/CATH LAB
INTRA_ARTERIAL | Status: AC
  Filled 2021-07-28: qty 10

## 2021-07-28 MED ORDER — VERAPAMIL HCL 2.5 MG/ML IV SOLN
INTRAVENOUS | Status: DC | PRN
  Administered 2021-07-28: 10 mL via INTRA_ARTERIAL

## 2021-07-28 MED ORDER — HEPARIN SODIUM (PORCINE) 5000 UNIT/ML IJ SOLN
60.0000 [IU]/kg | Freq: Once | INTRAMUSCULAR | Status: AC
  Administered 2021-07-28: 3400 [IU] via INTRAVENOUS
  Filled 2021-07-28: qty 1

## 2021-07-28 MED ORDER — VERAPAMIL HCL 2.5 MG/ML IV SOLN
INTRAVENOUS | Status: AC
  Filled 2021-07-28: qty 2

## 2021-07-28 MED ORDER — FENTANYL CITRATE (PF) 100 MCG/2ML IJ SOLN
INTRAMUSCULAR | Status: DC | PRN
  Administered 2021-07-28: 50 ug via INTRAVENOUS
  Administered 2021-07-28 (×2): 25 ug via INTRAVENOUS

## 2021-07-28 MED ORDER — TICAGRELOR 90 MG PO TABS
90.0000 mg | ORAL_TABLET | Freq: Two times a day (BID) | ORAL | Status: DC
  Administered 2021-07-28: 90 mg via ORAL
  Filled 2021-07-28: qty 1

## 2021-07-28 MED ORDER — LIDOCAINE HCL (PF) 1 % IJ SOLN
INTRAMUSCULAR | Status: DC | PRN
  Administered 2021-07-28: 2 mL

## 2021-07-28 MED ORDER — HEPARIN (PORCINE) IN NACL 1000-0.9 UT/500ML-% IV SOLN
INTRAVENOUS | Status: DC | PRN
  Administered 2021-07-28 (×2): 500 mL

## 2021-07-28 MED ORDER — MIDAZOLAM HCL 2 MG/2ML IJ SOLN
INTRAMUSCULAR | Status: DC | PRN
  Administered 2021-07-28: 1 mg via INTRAVENOUS
  Administered 2021-07-28: 2 mg via INTRAVENOUS

## 2021-07-28 MED ORDER — NITROGLYCERIN 1 MG/10 ML FOR IR/CATH LAB
INTRA_ARTERIAL | Status: DC | PRN
  Administered 2021-07-28 (×3): 100 ug via INTRACORONARY

## 2021-07-28 MED ORDER — LEVETIRACETAM 500 MG PO TABS
500.0000 mg | ORAL_TABLET | Freq: Two times a day (BID) | ORAL | Status: DC
  Filled 2021-07-28: qty 1

## 2021-07-28 MED ORDER — MIDAZOLAM HCL 2 MG/2ML IJ SOLN
INTRAMUSCULAR | Status: AC
  Filled 2021-07-28: qty 2

## 2021-07-28 MED ORDER — METHOCARBAMOL 500 MG PO TABS: 750.0000 mg | ORAL_TABLET | Freq: Four times a day (QID) | ORAL | Status: DC | PRN

## 2021-07-28 MED ORDER — SODIUM CHLORIDE 0.9% FLUSH: 3.0000 mL | INTRAVENOUS | Status: DC | PRN

## 2021-07-28 MED ORDER — SODIUM CHLORIDE 0.9 % WEIGHT BASED INFUSION
1.0000 mL/kg/h | INTRAVENOUS | Status: DC
  Administered 2021-07-28: 250 mL via INTRAVENOUS

## 2021-07-28 MED ORDER — CLONAZEPAM 0.5 MG PO TABS: 0.5000 mg | ORAL_TABLET | ORAL | Status: DC

## 2021-07-28 MED ORDER — GABAPENTIN 300 MG PO CAPS
300.0000 mg | ORAL_CAPSULE | Freq: Three times a day (TID) | ORAL | Status: DC
  Filled 2021-07-28: qty 1

## 2021-07-28 MED ORDER — ALBUTEROL SULFATE (2.5 MG/3ML) 0.083% IN NEBU: 2.5000 mg | INHALATION_SOLUTION | Freq: Four times a day (QID) | RESPIRATORY_TRACT | Status: DC | PRN

## 2021-07-28 MED ORDER — SODIUM CHLORIDE 0.9 % WEIGHT BASED INFUSION
1.0000 mL/kg/h | INTRAVENOUS | Status: AC
  Administered 2021-07-28: 1 mL/kg/h via INTRAVENOUS

## 2021-07-28 MED ORDER — OXYCODONE HCL 5 MG PO TABS
5.0000 mg | ORAL_TABLET | ORAL | Status: DC | PRN
  Administered 2021-07-28: 10 mg via ORAL
  Administered 2021-07-29 – 2021-07-30 (×4): 5 mg via ORAL
  Filled 2021-07-28: qty 2
  Filled 2021-07-28 (×4): qty 1

## 2021-07-28 MED ORDER — ACETAMINOPHEN 325 MG PO TABS
650.0000 mg | ORAL_TABLET | ORAL | Status: DC | PRN
  Administered 2021-07-28 – 2021-07-29 (×4): 650 mg via ORAL
  Filled 2021-07-28 (×4): qty 2

## 2021-07-28 MED ORDER — LORATADINE 10 MG PO TABS
10.0000 mg | ORAL_TABLET | Freq: Every day | ORAL | Status: DC
  Administered 2021-07-28: 10 mg via ORAL
  Filled 2021-07-28: qty 1

## 2021-07-28 MED ORDER — FLUTICASONE PROPIONATE 50 MCG/ACT NA SUSP
2.0000 | Freq: Every day | NASAL | Status: DC
  Filled 2021-07-28: qty 16

## 2021-07-28 MED ORDER — HEPARIN SODIUM (PORCINE) 1000 UNIT/ML IJ SOLN
INTRAMUSCULAR | Status: DC | PRN
  Administered 2021-07-28: 2000 [IU] via INTRAVENOUS
  Administered 2021-07-28 (×2): 3000 [IU] via INTRAVENOUS

## 2021-07-28 MED ORDER — HEPARIN SODIUM (PORCINE) 1000 UNIT/ML IJ SOLN
INTRAMUSCULAR | Status: AC
  Filled 2021-07-28: qty 1

## 2021-07-28 MED ORDER — IOHEXOL 350 MG/ML SOLN
INTRAVENOUS | Status: DC | PRN
  Administered 2021-07-28: 95 mL

## 2021-07-28 MED ORDER — LIDOCAINE HCL (PF) 1 % IJ SOLN
INTRAMUSCULAR | Status: AC
  Filled 2021-07-28: qty 30

## 2021-07-28 MED ORDER — HEPARIN (PORCINE) IN NACL 1000-0.9 UT/500ML-% IV SOLN
INTRAVENOUS | Status: AC
  Filled 2021-07-28: qty 1000

## 2021-07-28 MED ORDER — MORPHINE SULFATE (PF) 2 MG/ML IV SOLN
2.0000 mg | Freq: Once | INTRAVENOUS | Status: AC
  Administered 2021-07-28: 2 mg via INTRAVENOUS
  Filled 2021-07-28: qty 1

## 2021-07-28 MED ORDER — SODIUM CHLORIDE 0.9 % IV SOLN: 250.0000 mL | INTRAVENOUS | Status: DC | PRN

## 2021-07-28 MED ORDER — ONDANSETRON HCL 4 MG/2ML IJ SOLN
4.0000 mg | Freq: Four times a day (QID) | INTRAMUSCULAR | Status: DC | PRN
  Administered 2021-07-28 – 2021-07-30 (×3): 4 mg via INTRAVENOUS
  Filled 2021-07-28 (×3): qty 2

## 2021-07-28 MED ORDER — HEPARIN (PORCINE) 25000 UT/250ML-% IV SOLN
700.0000 [IU]/h | INTRAVENOUS | Status: DC
  Administered 2021-07-28: 700 [IU]/h via INTRAVENOUS
  Filled 2021-07-28: qty 250

## 2021-07-28 MED ORDER — ASPIRIN 81 MG PO CHEW
324.0000 mg | CHEWABLE_TABLET | Freq: Once | ORAL | Status: AC
  Administered 2021-07-28: 324 mg via ORAL
  Filled 2021-07-28: qty 4

## 2021-07-28 MED ORDER — MORPHINE SULFATE (PF) 2 MG/ML IV SOLN
2.0000 mg | INTRAVENOUS | Status: DC | PRN
  Administered 2021-07-28 (×2): 2 mg via INTRAVENOUS
  Filled 2021-07-28 (×2): qty 1

## 2021-07-28 MED ORDER — LABETALOL HCL 5 MG/ML IV SOLN: 10.0000 mg | INTRAVENOUS | Status: AC | PRN

## 2021-07-28 MED ORDER — SODIUM CHLORIDE 0.9% FLUSH
3.0000 mL | Freq: Two times a day (BID) | INTRAVENOUS | Status: DC
  Administered 2021-07-29: 3 mL via INTRAVENOUS

## 2021-07-28 MED ORDER — HYDRALAZINE HCL 20 MG/ML IJ SOLN: 10.0000 mg | INTRAMUSCULAR | Status: AC | PRN

## 2021-07-28 MED ORDER — FENTANYL CITRATE (PF) 100 MCG/2ML IJ SOLN
INTRAMUSCULAR | Status: AC
  Filled 2021-07-28: qty 2

## 2021-07-28 MED ORDER — NITROGLYCERIN IN D5W 200-5 MCG/ML-% IV SOLN
0.0000 ug/min | INTRAVENOUS | Status: DC
  Administered 2021-07-28: 5 ug/min via INTRAVENOUS
  Filled 2021-07-28: qty 250

## 2021-07-28 MED ORDER — SODIUM CHLORIDE 0.9 % WEIGHT BASED INFUSION: 3.0000 mL/kg/h | INTRAVENOUS | Status: DC

## 2021-07-28 MED ORDER — SODIUM CHLORIDE 0.9% FLUSH
3.0000 mL | Freq: Two times a day (BID) | INTRAVENOUS | Status: DC
  Administered 2021-07-29 – 2021-07-30 (×4): 3 mL via INTRAVENOUS

## 2021-07-28 SURGICAL SUPPLY — 19 items
BALLN SAPPHIRE 2.5X12 (BALLOONS) ×2
BALLN SAPPHIRE ~~LOC~~ 2.75X10 (BALLOONS) ×1 IMPLANT
BALLN SAPPHIRE ~~LOC~~ 3.25X15 (BALLOONS) ×1 IMPLANT
BALLOON SAPPHIRE 2.5X12 (BALLOONS) IMPLANT
CATH 5FR JL3.5 JR4 ANG PIG MP (CATHETERS) ×1 IMPLANT
CATH LAUNCHER 5F JR4 (CATHETERS) ×1 IMPLANT
DEVICE RAD TR BAND REGULAR (VASCULAR PRODUCTS) ×1 IMPLANT
GLIDESHEATH SLEND SS 6F .021 (SHEATH) ×1 IMPLANT
GUIDEWIRE INQWIRE 1.5J.035X260 (WIRE) IMPLANT
INQWIRE 1.5J .035X260CM (WIRE) ×2
KIT ENCORE 26 ADVANTAGE (KITS) ×1 IMPLANT
KIT HEART LEFT (KITS) ×2 IMPLANT
KIT HEMO VALVE WATCHDOG (MISCELLANEOUS) ×1 IMPLANT
PACK CARDIAC CATHETERIZATION (CUSTOM PROCEDURE TRAY) ×2 IMPLANT
STENT ONYX FRONTIER 2.5X15 (Permanent Stent) ×1 IMPLANT
STENT ONYX FRONTIER 3.0X18 (Permanent Stent) ×1 IMPLANT
TRANSDUCER W/STOPCOCK (MISCELLANEOUS) ×2 IMPLANT
TUBING CIL FLEX 10 FLL-RA (TUBING) ×2 IMPLANT
WIRE COUGAR XT STRL 190CM (WIRE) ×1 IMPLANT

## 2021-07-28 NOTE — Progress Notes (Signed)
ANTICOAGULATION CONSULT NOTE - Initial Consult  Pharmacy Consult for heparin Indication: chest pain/ACS  No Known Allergies  Patient Measurements: Height: 5\' 1"  (154.9 cm) Weight: 56.7 kg (125 lb) IBW/kg (Calculated) : 47.8 Heparin Dosing Weight: 56.7 kg  Vital Signs: Temp: 98.1 F (36.7 C) (10/12 0900) BP: 121/93 (10/12 1200) Pulse Rate: 52 (10/12 1200)  Labs: Recent Labs    07/28/21 0904  HGB 14.1  HCT 44.5  PLT 223  CREATININE 0.86  TROPONINIHS 1,367*    Estimated Creatinine Clearance: 55.8 mL/min (by C-G formula based on SCr of 0.86 mg/dL).   Medical History: Past Medical History:  Diagnosis Date   Anxiety    COPD (chronic obstructive pulmonary disease) (HCC)    Muscle spasm     Medications: see MAR  Assessment: 55 yo F with NSTEMI. Cards consulted, cath today. Heparin consult for [facility]. No AC PTA. CBC wnl.   Goal of Therapy:  Heparin level 0.3-0.7 units/ml Monitor platelets by anticoagulation protocol: Yes   Plan:  Give 3400 units bolus x 1 Start heparin infusion at 700 units/hr Check anti-Xa level in 6 hours and daily while on heparin Continue to monitor H&H and platelets  [facility], PharmD, [facility] Emergency Medicine Clinical Pharmacist ED RPh Phone: [phone] Main RX: [phone]

## 2021-07-28 NOTE — ED Provider Notes (Signed)
[facility] EMERGENCY DEPARTMENT Provider Note   CSN: [id number] Arrival date & time: 07/28/21  9562     History Chief Complaint  Patient presents with   Chest Pain    Tonya Miller is a 55 y.o. female.  HPI 54 year old female history of COPD, smoker, anxiety presents today complaining of chest pain that awoke her at about 3 AM.  Pain radiated from bilateral shoulder blades into her anterior chest area.  She describes it as dull and pressure-like in nature.  It is 9 out of 10.  She has not had any relief.  She has not had any definite worsening or decreasing factors.  She took acetaminophen without result.  She has not taken any other medications.  She has no prior history of similar symptoms or cardiac disease.  She denies history of DVT, PE, cough, fever, abdominal pain.  She had some associated dyspnea but no diaphoresis, nausea, vomiting, lightheadedness, or syncope.  She denies any prior cardiac evaluation. Family history includes her mother having congestive heart failure but she is not sure what age that started.  Her mother lived until age 34.    Past Medical History:  Diagnosis Date   Anxiety    COPD (chronic obstructive pulmonary disease) ([place])    Muscle spasm     Patient Active Problem List   Diagnosis Date Noted   Subarachnoid hemorrhage ([place]) 09/05/2017    Past Surgical History:  Procedure Laterality Date   BACK SURGERY     kyphoplasty   BACK SURGERY     CESAREAN SECTION       OB History   No obstetric history on file.     History reviewed. No pertinent family history.  Social History   Tobacco Use   Smoking status: Every Day    Packs/day: 1.00    Types: Cigarettes   Smokeless tobacco: Never  Substance Use Topics   Alcohol use: No   Drug use: No    Home Medications Prior to Admission medications   Medication Sig Start Date End Date Taking? Authorizing Provider  albuterol (PROAIR HFA) 108 (90 Base) MCG/ACT inhaler Inhale 2  puffs into the lungs every 6 (six) hours as needed for wheezing or shortness of breath.    [provider]  azithromycin (ZITHROMAX Z-PAK) 250 MG tablet Take 2 tablets (500 mg) on  Day 1,  followed by 1 tablet (250 mg) once daily on Days 2 through 5. 03/13/20   Laban Emperor, PA-C  Butalbital-Acetaminophen 50-300 MG CAPS Take 1 capsule by mouth See admin instructions. 1 tablet every 6-8 hours as needed for headaches    [provider]  cetirizine (ZYRTEC ALLERGY) 10 MG tablet Take 1 tablet (10 mg total) by mouth daily. 03/13/20 03/13/21  Laban Emperor, PA-C  clonazePAM (KLONOPIN) 0.5 MG tablet Take 0.5 mg by mouth See admin instructions. 0.5 mg two to three times a day as needed for anxiety    [provider]  diclofenac (VOLTAREN) 75 MG EC tablet Take 1 tablet (75 mg total) by mouth 2 (two) times daily. 09/06/17   Ditty, Kevan Ny, MD  fluticasone (FLONASE) 50 MCG/ACT nasal spray Place 2 sprays into both nostrils daily. 03/13/20 03/13/21  Laban Emperor, PA-C  gabapentin (NEURONTIN) 300 MG capsule Take 300 mg by mouth 3 (three) times daily.    [provider]  levETIRAcetam (KEPPRA) 500 MG tablet Take 1 tablet (500 mg total) by mouth 2 (two) times daily. 09/06/17   Ditty, Marland Kitchen  Kevan Ny, MD  medroxyPROGESTERone (PROVERA) 2.5 MG tablet Take 2.5 mg by mouth daily.    [provider]  methocarbamol (ROBAXIN) 750 MG tablet Take 1 tablet (750 mg total) by mouth every 6 (six) hours as needed (muscle pain or spasm). 09/06/17   Ditty, Kevan Ny, MD  methocarbamol (ROBAXIN-750) 750 MG tablet Take 1 tablet (750 mg total) by mouth every 6 (six) hours as needed (muscle pain and spasm). 09/06/17   Ditty, Kevan Ny, MD  pregabalin (LYRICA) 75 MG capsule Take 1 capsule (75 mg total) by mouth 2 (two) times daily. 10/04/15 10/03/16  Nance Pear, MD    Allergies    Patient has no known allergies.  Review of Systems   Review of Systems  All other  systems reviewed and are negative.  Physical Exam Updated Vital Signs BP (!) 155/86 (BP Location: Right Arm)   Pulse 72   Temp 98.1 F (36.7 C)   Resp 18   Ht 1.549 m (5' 1")   Wt 56.7 kg   LMP  (LMP Unknown)   SpO2 95%   BMI 23.62 kg/m   Physical Exam Vitals and nursing note reviewed.  HENT:     Head: Normocephalic.  Eyes:     Pupils: Pupils are equal, round, and reactive to light.  Cardiovascular:     Rate and Rhythm: Normal rate and regular rhythm.     Heart sounds: Normal heart sounds.  Pulmonary:     Effort: Pulmonary effort is normal.     Breath sounds: Normal breath sounds.  Abdominal:     General: Bowel sounds are normal.     Palpations: Abdomen is soft.  Musculoskeletal:        General: Normal range of motion.     Cervical back: Normal range of motion.  Skin:    General: Skin is warm and dry.     Capillary Refill: Capillary refill takes less than 2 seconds.  Neurological:     General: No focal deficit present.     Mental Status: She is alert. She is disoriented.  Psychiatric:        Mood and Affect: Mood normal.    ED Results / Procedures / Treatments   Labs (all labs ordered are listed, but only abnormal results are displayed) Labs Reviewed  BASIC METABOLIC PANEL - Abnormal; Notable for the following components:      Result Value   Glucose, Bld 103 (*)    Calcium 8.8 (*)    All other components within normal limits  CBC - Abnormal; Notable for the following components:   MCV 103.7 (*)    All other components within normal limits  TROPONIN I (HIGH SENSITIVITY) - Abnormal; Notable for the following components:   Troponin I (High Sensitivity) 1,367 (*)    All other components within normal limits  I-STAT BETA HCG BLOOD, ED ([facility], [facility], [facility] ONLY)  CBG MONITORING, ED  TROPONIN I (HIGH SENSITIVITY)    EKG EKG Interpretation  Date/Time:  Wednesday July 28 2021 11:04:57 EDT Ventricular Rate:  61 PR Interval:  160 QRS Duration: 72 QT  Interval:  427 QTC Calculation: 431 R Axis:   58 Text Interpretation: Sinus rhythm Atrial premature complex Confirmed by Pattricia Boss [phone]) on 07/28/2021 11:08:40 AM  Radiology DG Chest 2 View  Result Date: 07/28/2021 CLINICAL DATA:  Chest pain for 1 day EXAM: CHEST - 2 VIEW COMPARISON:  03/13/2020 FINDINGS: The heart size and mediastinal contours are within normal limits. There is a  new irregular nodular opacity of the peripheral right upper lobe measuring approximately 1.4 cm in projection. The visualized skeletal structures are unremarkable. IMPRESSION: 1.  No acute abnormality of the lungs. 2. There is a new irregular nodular opacity of the peripheral right upper lobe measuring approximately 1.4 cm in projection. Recommend CT to further evaluate. Electronically Signed   By: Delanna Ahmadi M.D.   On: 07/28/2021 09:45    Procedures .Critical Care E&M Performed by: Pattricia Boss, MD  Critical care provider statement:    Critical care time (minutes):  80   Critical care was necessary to treat or prevent imminent or life-threatening deterioration of the following conditions:  Cardiac failure   Critical care was time spent personally by me on the following activities:  Development of treatment plan with patient or surrogate, discussions with consultants, evaluation of patient's response to treatment, examination of patient, ordering and review of laboratory studies, ordering and review of radiographic studies, pulse oximetry and re-evaluation of patient's condition After initial E/M assessment, critical care services were subsequently performed that were exclusive of separately billable procedures or treatment.     Medications Ordered in ED Medications  aspirin chewable tablet 324 mg (has no administration in time range)  heparin injection 3,400 Units (has no administration in time range)  nitroGLYCERIN 50 mg in dextrose 5 % 250 mL (0.2 mg/mL) infusion (has no administration in time range)     ED Course  I have reviewed the triage vital signs and the nursing notes.  Pertinent labs & imaging results that were available during my care of the patient were reviewed by me and considered in my medical decision making (see chart for details).  Clinical Course as of 07/28/21 1204  Wed Jul 28, 2021  1110 CBC, be met, and troponin reviewed and interpreted.  CBC and c-Met are essentially within normal limits with the exception of mild hypocalcium .  Troponin is significantly elevated at 1367 [DR]  1110 Chest x-ray personally reviewed and interpreted.  I reviewed radiologist interpretation and nodule is noted.  Otherwise normal. [DR]  1111 Eitel signs reviewed and currently are normal with the exception of mild hypertension at 135/86. [DR]    Clinical Course User Index [DR] Pattricia Boss, MD   MDM Rules/Calculators/A&P                          55 year old female presents today with chest pain.  Initial EKG reviewed from triage obtained at 855 AM it does not show any evidence of acute ischemia.  However troponin resulted at 1367.  Patient has had ongoing 9 out of 10 chest pain.  Currently we are planning to repeat her EKG.  Heparin, aspirin, and nitro have been ordered.  Cardiology is paged. 11:22 AM Discussed with Trish.  Cardiology will see, COVID swab added Repeat EKG continues without acute ischemic changes Patient with heparin started, aspirin given, nitro started.  Continues 9/10 pain.  Morphine ordered. Patient seen and evaluated for chest pain.  Differential diagnosis of serious/life threatening causes of chest pain includes ACS, other diseases of the heart such as myocarditis or pericarditis, lung etiologies such as infection or pneumothorax, diseases of the great vessels such as aortic dissection or AAA, pulmonary embolism, or GI sources such as cholecystitis or other upper abdominal causes. Likely Nstemi- heart score documented, EKG reviewed, Given the timing of pain to ER  presentation, single troponin 1367 delta troponin pending  Doubt myocarditis/pericarditis/tamponade based on history,  review of ekg and labs Doubt aortic dissection based on history and review of imaging Doubt intrinsic lung causes such as pneumonia or pneumothorax, based on history, physical exam, and studies obtained. Doubt PE based on history, physical exam, and PERC Doubt acute GI etiology requiring intervention based on history, physical exam and labs.  Cardiology has seen patient and plans to take to cath lab today. Patient continues with pain 9/10 but otherwise stable. Final Clinical Impression(s) / ED Diagnoses Final diagnoses:  None    Rx / DC Orders ED Discharge Orders     None        Pattricia Boss, MD 07/28/21 1208

## 2021-07-28 NOTE — H&P (Signed)
Cardiology Admission History and Physical:   Patient ID: ALLE DIFABIO MRN: [id number]; DOB: [birth year]   Admission date: 07/28/2021  PCP:  Evelene Croon, MD   [facility] Providers Cardiologist:  None New  Chief Complaint:  Chest pain  Patient Profile:   Tonya Miller is a 55 y.o. female with history of COPD, ongoing tobacco abuse who is being seen 07/28/2021 for the evaluation of chest pain.   History of Present Illness:   [name] is a 55 yo female with history of COPD and tobacco abuse who is admitted from the ED where she presented with c/o chest pain for the past 8 hours. Her pain started at 3am. Her pain is located in the center of her chest and radiates to her back. She has associated dyspnea. EKG with no ischemic changes. Her first Hstroponin is 1327. She reports smoking cigarettes since age 15. She was feeling ok when she went to bed last night. She denies any angina type pains over the past few weeks until last night. No prior joint issues or issues with joint dislocation. No recent long travel. She has had ongoing stress at home with general life issues.    Past Medical History:  Diagnosis Date   Anxiety    COPD (chronic obstructive pulmonary disease) (HCC)    Muscle spasm     Past Surgical History:  Procedure Laterality Date   BACK SURGERY     kyphoplasty   BACK SURGERY     CESAREAN SECTION       Medications Prior to Admission: Prior to Admission medications   Medication Sig Start Date End Date Taking? Authorizing Provider  albuterol (PROAIR HFA) 108 (90 Base) MCG/ACT inhaler Inhale 2 puffs into the lungs every 6 (six) hours as needed for wheezing or shortness of breath.    [provider]  azithromycin (ZITHROMAX Z-PAK) 250 MG tablet Take 2 tablets (500 mg) on  Day 1,  followed by 1 tablet (250 mg) once daily on Days 2 through 5. 03/13/20   Enid Derry, PA-C  Butalbital-Acetaminophen 50-300 MG CAPS Take 1 capsule by mouth See admin  instructions. 1 tablet every 6-8 hours as needed for headaches    [provider]  cetirizine (ZYRTEC ALLERGY) 10 MG tablet Take 1 tablet (10 mg total) by mouth daily. 03/13/20 03/13/21  Enid Derry, PA-C  clonazePAM (KLONOPIN) 0.5 MG tablet Take 0.5 mg by mouth See admin instructions. 0.5 mg two to three times a day as needed for anxiety    [provider]  diclofenac (VOLTAREN) 75 MG EC tablet Take 1 tablet (75 mg total) by mouth 2 (two) times daily. 09/06/17   Ditty, Loura Halt, MD  fluticasone (FLONASE) 50 MCG/ACT nasal spray Place 2 sprays into both nostrils daily. 03/13/20 03/13/21  Enid Derry, PA-C  gabapentin (NEURONTIN) 300 MG capsule Take 300 mg by mouth 3 (three) times daily.    [provider]  levETIRAcetam (KEPPRA) 500 MG tablet Take 1 tablet (500 mg total) by mouth 2 (two) times daily. 09/06/17   Ditty, Loura Halt, MD  medroxyPROGESTERone (PROVERA) 2.5 MG tablet Take 2.5 mg by mouth daily.    [provider]  methocarbamol (ROBAXIN) 750 MG tablet Take 1 tablet (750 mg total) by mouth every 6 (six) hours as needed (muscle pain or spasm). 09/06/17   Ditty, Loura Halt, MD  methocarbamol (ROBAXIN-750) 750 MG tablet Take 1 tablet (750 mg total) by mouth every 6 (six) hours as needed (muscle pain and  spasm). 09/06/17   Ditty, Loura Halt, MD  pregabalin (LYRICA) 75 MG capsule Take 1 capsule (75 mg total) by mouth 2 (two) times daily. 10/04/15 10/03/16  Phineas Semen, MD     Allergies:   No Known Allergies  Social History:   Social History   Socioeconomic History   Marital status: Legally Separated    Spouse name: Not on file   Number of children: Not on file   Years of education: Not on file   Highest education level: Not on file  Occupational History   Not on file  Tobacco Use   Smoking status: Every Day    Packs/day: 1.00    Types: Cigarettes   Smokeless tobacco: Never  Substance and Sexual Activity   Alcohol use: No    Drug use: No   Sexual activity: Not on file  Other Topics Concern   Not on file  Social History Narrative   ** Merged History Encounter **       Social Determinants of Health   Financial Resource Strain: Not on file  Food Insecurity: Not on file  Transportation Needs: Not on file  Physical Activity: Not on file  Stress: Not on file  Social Connections: Not on file  Intimate Partner Violence: Not on file    Family History:   The patient's family history includes Heart disease in her brother; Heart failure in her mother.    ROS:  Please see the history of present illness.  All other ROS reviewed and negative.     Physical Exam/Data:   Vitals:   07/28/21 1130 07/28/21 1135 07/28/21 1140 07/28/21 1145  BP: (!) 135/96 (!) 151/94 132/82 (!) 147/81  Pulse: (!) 47 (!) 56 (!) 53 (!) 51  Resp: 13 12 14 14   Temp:      SpO2: 99% 98% 99% 99%  Weight:      Height:       No intake or output data in the 24 hours ending 07/28/21 1154 Last 3 Weights 07/28/2021 03/13/2020 03/01/2018  Weight (lbs) 125 lb 130 lb 139 lb  Weight (kg) 56.7 kg 58.968 kg 63.05 kg     Body mass index is 23.62 kg/m.  General:  Well nourished, well developed, in no acute distress.  HEENT: normal Neck: no JVD Vascular: No carotid bruits; Distal pulses 2+ bilaterally   Cardiac:  normal S1, S2; RRR; no murmur  Lungs:  clear to auscultation bilaterally, no wheezing, rhonchi or rales  Abd: soft, nontender, no hepatomegaly  Ext: no LE edema Musculoskeletal:  No deformities, BUE and BLE strength normal and equal Skin: warm and dry  Neuro:  CNs 2-12 intact, no focal abnormalities noted Psych:  Normal affect    EKG:  The ECG that was done was personally reviewed and demonstrates sinus with no ischemic changes  Relevant CV Studies:   Laboratory Data:  High Sensitivity Troponin:   Recent Labs  Lab 07/28/21 0904  TROPONINIHS 1,367*      Chemistry Recent Labs  Lab 07/28/21 0904  NA 138  K 4.4  CL  105  CO2 23  GLUCOSE 103*  BUN 8  CREATININE 0.86  CALCIUM 8.8*  GFRNONAA >60  ANIONGAP 10    No results for input(s): PROT, ALBUMIN, AST, ALT, ALKPHOS, BILITOT in the last 168 hours. Lipids No results for input(s): CHOL, TRIG, HDL, LABVLDL, LDLCALC, CHOLHDL in the last 168 hours. Hematology Recent Labs  Lab 07/28/21 0904  WBC 9.7  RBC 4.29  HGB 14.1  HCT 44.5  MCV 103.7*  MCH 32.9  MCHC 31.7  RDW 12.9  PLT 223   Thyroid No results for input(s): TSH, FREET4 in the last 168 hours. BNPNo results for input(s): BNP, PROBNP in the last 168 hours.  DDimer No results for input(s): DDIMER in the last 168 hours.   Radiology/Studies:  DG Chest 2 View  Result Date: 07/28/2021 CLINICAL DATA:  Chest pain for 1 day EXAM: CHEST - 2 VIEW COMPARISON:  03/13/2020 FINDINGS: The heart size and mediastinal contours are within normal limits. There is a new irregular nodular opacity of the peripheral right upper lobe measuring approximately 1.4 cm in projection. The visualized skeletal structures are unremarkable. IMPRESSION: 1.  No acute abnormality of the lungs. 2. There is a new irregular nodular opacity of the peripheral right upper lobe measuring approximately 1.4 cm in projection. Recommend CT to further evaluate. Electronically Signed   By: Jearld Lesch M.D.   On: 07/28/2021 09:45     Assessment and Plan:   NSTEMI: Her presentation is consistent with ACS. The differential includes stress induced cardiomyopathy and aortic dissection but her story seems more consistent with unstable angina. Her troponin is elevated. I think a cardiac catheterization is indicated. I will start a heparin drip now. Continue IV NTG drip. She has been loaded with ASA 325 mg po x 1. Will plan cardiac cath today.  I have reviewed the risks, indications, and alternatives to cardiac catheterization, possible angioplasty, and stenting with the patient. Risks include but are not limited to bleeding, infection, vascular  injury, stroke, myocardial infection, arrhythmia, kidney injury, radiation-related injury in the case of prolonged fluoroscopy use, emergency cardiac surgery, and death. The patient understands the risks of serious complication is 1-2 in 1000 with diagnostic cardiac cath and 1-2% or less with angioplasty/stenting.   2. Tobacco abuse: Smoking cessation is advised.    Risk Assessment/Risk Scores:    Severity of Illness: The appropriate patient status for this patient is INPATIENT. Inpatient status is judged to be reasonable and necessary in order to provide the required intensity of service to ensure the patient's safety. The patient's presenting symptoms, physical exam findings, and initial radiographic and laboratory data in the context of their chronic comorbidities is felt to place them at high risk for further clinical deterioration. Furthermore, it is not anticipated that the patient will be medically stable for discharge from the hospital within 2 midnights of admission. The following factors support the patient status of inpatient.   " The patient's presenting symptoms include chest pain " The worrisome physical exam findings include " The initial radiographic and laboratory data are worrisome because of elevated troponin " The chronic co-morbidities include tobacco abuse   * I certify that at the point of admission it is my clinical judgment that the patient will require inpatient hospital care spanning beyond 2 midnights from the point of admission due to high intensity of service, high risk for further deterioration and high frequency of surveillance required.*   For questions or updates, please contact [facility] Please consult [email or web address] for contact info under     Signed, Verne Carrow, MD  07/28/2021 11:54 AM

## 2021-07-28 NOTE — Interval H&P Note (Signed)
Cath Lab Visit (complete for each Cath Lab visit)  Clinical Evaluation Leading to the Procedure:   ACS: Yes.    Non-ACS:    Anginal Classification: CCS IV  Anti-ischemic medical therapy: No Therapy  Non-Invasive Test Results: No non-invasive testing performed  Prior CABG: No previous CABG      History and Physical Interval Note:  07/28/2021 1:16 PM  Tonya Miller  has presented today for surgery, with the diagnosis of nstemi.  The various methods of treatment have been discussed with the patient and family. After consideration of risks, benefits and other options for treatment, the patient has consented to  Procedure(s): LEFT HEART CATH AND CORONARY ANGIOGRAPHY (N/A) as a surgical intervention.  The patient's history has been reviewed, patient examined, no change in status, stable for surgery.  I have reviewed the patient's chart and labs.  Questions were answered to the patient's satisfaction.     [name]

## 2021-07-28 NOTE — ED Triage Notes (Signed)
Pt endorses CP that started around 3 am today. Pain goes across chest and to bilateral shoulder blades. Endorses lightheadedness and bilateral arm numbness. Pt took 2 tylenol today.

## 2021-07-28 NOTE — ED Notes (Signed)
Cardiology at bedside.

## 2021-07-29 ENCOUNTER — Other Ambulatory Visit: Payer: Self-pay

## 2021-07-29 ENCOUNTER — Encounter (HOSPITAL_COMMUNITY): Payer: Self-pay | Admitting: Cardiovascular Disease

## 2021-07-29 ENCOUNTER — Other Ambulatory Visit (HOSPITAL_COMMUNITY): Payer: Self-pay

## 2021-07-29 ENCOUNTER — Inpatient Hospital Stay (HOSPITAL_COMMUNITY): Payer: 59

## 2021-07-29 DIAGNOSIS — Z720 Tobacco use: Secondary | ICD-10-CM

## 2021-07-29 DIAGNOSIS — J449 Chronic obstructive pulmonary disease, unspecified: Secondary | ICD-10-CM

## 2021-07-29 DIAGNOSIS — I214 Non-ST elevation (NSTEMI) myocardial infarction: Secondary | ICD-10-CM | POA: Diagnosis not present

## 2021-07-29 DIAGNOSIS — I25118 Atherosclerotic heart disease of native coronary artery with other forms of angina pectoris: Secondary | ICD-10-CM

## 2021-07-29 DIAGNOSIS — E785 Hyperlipidemia, unspecified: Secondary | ICD-10-CM

## 2021-07-29 DIAGNOSIS — R911 Solitary pulmonary nodule: Secondary | ICD-10-CM

## 2021-07-29 DIAGNOSIS — I2510 Atherosclerotic heart disease of native coronary artery without angina pectoris: Secondary | ICD-10-CM

## 2021-07-29 DIAGNOSIS — R079 Chest pain, unspecified: Secondary | ICD-10-CM

## 2021-07-29 LAB — ECHOCARDIOGRAM COMPLETE
Area-P 1/2: 3.2 cm2
Height: 61 in
S' Lateral: 2.5 cm
Weight: 2000 oz

## 2021-07-29 LAB — CBC
HCT: 38.4 % (ref 36.0–46.0)
Hemoglobin: 12.7 g/dL (ref 12.0–15.0)
MCH: 34.1 pg — ABNORMAL HIGH (ref 26.0–34.0)
MCHC: 33.1 g/dL (ref 30.0–36.0)
MCV: 103.2 fL — ABNORMAL HIGH (ref 80.0–100.0)
Platelets: 188 10*3/uL (ref 150–400)
RBC: 3.72 MIL/uL — ABNORMAL LOW (ref 3.87–5.11)
RDW: 12.9 % (ref 11.5–15.5)
WBC: 12.6 10*3/uL — ABNORMAL HIGH (ref 4.0–10.5)
nRBC: 0 % (ref 0.0–0.2)

## 2021-07-29 LAB — BASIC METABOLIC PANEL
Anion gap: 6 (ref 5–15)
BUN: 8 mg/dL (ref 6–20)
CO2: 25 mmol/L (ref 22–32)
Calcium: 8.3 mg/dL — ABNORMAL LOW (ref 8.9–10.3)
Chloride: 106 mmol/L (ref 98–111)
Creatinine, Ser: 0.84 mg/dL (ref 0.44–1.00)
GFR, Estimated: >60 mL/min (ref >60)
Glucose, Bld: 118 mg/dL — ABNORMAL HIGH (ref 70–99)
Potassium: 4.1 mmol/L (ref 3.5–5.1)
Sodium: 137 mmol/L (ref 135–145)

## 2021-07-29 LAB — LIPID PANEL
Cholesterol: 157 mg/dL (ref 0–200)
HDL: 29 mg/dL — ABNORMAL LOW (ref >40)
LDL Cholesterol: 100 mg/dL — ABNORMAL HIGH (ref 0–99)
Total CHOL/HDL Ratio: 5.4 RATIO
Triglycerides: 139 mg/dL (ref <150)
VLDL: 28 mg/dL (ref 0–40)

## 2021-07-29 LAB — HEMOGLOBIN A1C
Hgb A1c MFr Bld: 5.6 % (ref 4.8–5.6)
Mean Plasma Glucose: 114.02 mg/dL

## 2021-07-29 MED ORDER — ASPIRIN EC 81 MG PO TBEC
81.0000 mg | DELAYED_RELEASE_TABLET | Freq: Every day | ORAL | Status: DC
  Administered 2021-07-29 – 2021-07-31 (×3): 81 mg via ORAL
  Filled 2021-07-29 (×3): qty 1

## 2021-07-29 MED ORDER — ISOSORBIDE MONONITRATE ER 30 MG PO TB24
30.0000 mg | ORAL_TABLET | Freq: Every day | ORAL | Status: DC
  Administered 2021-07-29 – 2021-07-31 (×3): 30 mg via ORAL
  Filled 2021-07-29 (×3): qty 1

## 2021-07-29 MED ORDER — DEXAMETHASONE SODIUM PHOSPHATE 4 MG/ML IJ SOLN
4.0000 mg | Freq: Once | INTRAMUSCULAR | Status: AC
  Administered 2021-07-29: 4 mg via INTRAVENOUS
  Filled 2021-07-29: qty 1

## 2021-07-29 MED ORDER — NITROGLYCERIN 0.4 MG SL SUBL
0.4000 mg | SUBLINGUAL_TABLET | SUBLINGUAL | Status: DC | PRN
  Administered 2021-07-30 – 2021-07-31 (×3): 0.4 mg via SUBLINGUAL
  Filled 2021-07-29 (×3): qty 1

## 2021-07-29 MED ORDER — METOCLOPRAMIDE HCL 5 MG/ML IJ SOLN
5.0000 mg | Freq: Once | INTRAMUSCULAR | Status: AC
  Administered 2021-07-29: 5 mg via INTRAVENOUS
  Filled 2021-07-29: qty 2

## 2021-07-29 MED ORDER — DIPHENHYDRAMINE HCL 50 MG/ML IJ SOLN
12.5000 mg | Freq: Once | INTRAMUSCULAR | Status: AC
  Administered 2021-07-29: 12.5 mg via INTRAVENOUS
  Filled 2021-07-29: qty 1

## 2021-07-29 MED ORDER — ATORVASTATIN CALCIUM 80 MG PO TABS
80.0000 mg | ORAL_TABLET | Freq: Every day | ORAL | Status: DC
  Administered 2021-07-29 – 2021-07-31 (×3): 80 mg via ORAL
  Filled 2021-07-29 (×3): qty 1

## 2021-07-29 MED ORDER — SODIUM CHLORIDE 0.9 % IV BOLUS
250.0000 mL | Freq: Once | INTRAVENOUS | Status: AC
  Administered 2021-07-29: 250 mL via INTRAVENOUS

## 2021-07-29 MED ORDER — TRAMADOL HCL 50 MG PO TABS
50.0000 mg | ORAL_TABLET | Freq: Once | ORAL | Status: AC
Start: 2021-07-29 — End: 2021-07-29
  Administered 2021-07-29: 50 mg via ORAL
  Filled 2021-07-29: qty 1

## 2021-07-29 MED ORDER — CLOPIDOGREL BISULFATE 75 MG PO TABS
75.0000 mg | ORAL_TABLET | Freq: Every day | ORAL | Status: DC
  Administered 2021-07-30 – 2021-07-31 (×2): 75 mg via ORAL
  Filled 2021-07-29 (×2): qty 1

## 2021-07-29 MED ORDER — CLOPIDOGREL BISULFATE 75 MG PO TABS
300.0000 mg | ORAL_TABLET | Freq: Once | ORAL | Status: AC
  Administered 2021-07-29: 300 mg via ORAL
  Filled 2021-07-29: qty 4

## 2021-07-29 MED ORDER — NICOTINE 21 MG/24HR TD PT24
21.0000 mg | MEDICATED_PATCH | Freq: Every day | TRANSDERMAL | Status: DC
  Administered 2021-07-29 – 2021-07-31 (×3): 21 mg via TRANSDERMAL
  Filled 2021-07-29 (×3): qty 1
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
## 2021-07-29 NOTE — TOC Benefit Eligibility Note (Signed)
Patient Product/process development scientist completed.    The patient is currently admitted and upon discharge could be taking Brilinta 90 mg.  The current 30 day co-pay is, $282.42.   The patient is insured through [employer]     Roland Earl, CPhT Pharmacy Patient Advocate Specialist [place] [facility] Stewardship Team Direct Number: [phone]  Fax: [phone]

## 2021-07-29 NOTE — Progress Notes (Signed)
  Echocardiogram 2D Echocardiogram has been performed.  Delcie Roch 07/29/2021, 5:28 PM

## 2021-07-29 NOTE — Progress Notes (Signed)
    Patient continues to complain of throbbing headache, migraine like. She has been weaned from IV nitro, and started on Imdur. Will stop Imdur. Ordered combination IV meds for migraine. BP remains soft. Give 250cc bolus x1  Signed, Laverda Page, NP-C 07/29/2021, 3:33 PM Pager: [phone]

## 2021-07-29 NOTE — Progress Notes (Signed)
CARDIAC REHAB PHASE I   PRE:  Rate/Rhythm: 57 SB  BP:  Supine: 99/63  Sitting: 106/52  Standing: 82/64   SaO2: 95 on 2.5 L SaO2: 86 RA  MODE:  Ambulation: None   Did not walk with pt due to pt being dizzy, low BP while standing and low 86 SaO2 while standing without oxygen. Pt states she does not use oxygen at home. Nurse aware of orthostatics and low SaO2 without oxygen. Returned pt to bed and resumed oxygen at 2/5 L. Reviewed education on MI book, radial restrictions, stent placement and home exercise. Advised about adherence to NTG, ASA and Plavix. Encouraged pt on heart health diet, smoking cessation and participation in CRP2. Pt verbalized understanding and all questions were answered. Pt left in bed with call bell in reach.   [id number] Harrie Jeans ACSM-EP 07/29/2021 9:04 AM

## 2021-07-29 NOTE — Discharge Instructions (Signed)
Post NSTEMI: °NO HEAVY LIFTING X 2 WEEKS. °NO SEXUAL ACTIVITY X 2 WEEKS. °NO DRIVING X 1 WEEK. °NO SOAKING BATHS, HOT TUBS, POOLS, ETC., X 7 DAYS. ° °Radial Site Care: °Refer to this sheet in the next few weeks. These instructions provide you with information on caring for yourself after your procedure. Your caregiver may also give you more specific instructions. Your treatment has been planned according to current medical practices, but problems sometimes occur. Call your caregiver if you have any problems or questions after your procedure. °HOME CARE INSTRUCTIONS °· You may shower the day after the procedure. Remove the bandage (dressing) and gently wash the site with plain soap and water. Gently pat the site dry.  °· Do not apply powder or lotion to the site.  °· Do not submerge the affected site in water for 3 to 5 days.  °· Inspect the site at least twice daily.  °· Do not flex or bend the affected arm for 24 hours.  °· No lifting over 5 pounds (2.3 kg) for 5 days after your procedure.  °· Do not drive home if you are discharged the same day of the procedure. Have someone else drive you.  °What to expect: °· Any bruising will usually fade within 1 to 2 weeks.  °· Blood that collects in the tissue (hematoma) may be painful to the touch. It should usually decrease in size and tenderness within 1 to 2 weeks.  °SEEK IMMEDIATE MEDICAL CARE IF: °· You have unusual pain at the radial site.  °· You have redness, warmth, swelling, or pain at the radial site.  °· You have drainage (other than a small amount of blood on the dressing).  °· You have chills.  °· You have a fever or persistent symptoms for more than 72 hours.  °· You have a fever and your symptoms suddenly get worse.  °· Your arm becomes pale, cool, tingly, or numb.  °· You have heavy bleeding from the site. Hold pressure on the site.  ° ° °

## 2021-07-29 NOTE — Plan of Care (Signed)
  Problem: Education: Goal: Knowledge of General Education information will improve Description: Including pain rating scale, medication(s)/side effects and non-pharmacologic comfort measures Outcome: Completed/Met

## 2021-07-30 ENCOUNTER — Other Ambulatory Visit (HOSPITAL_COMMUNITY): Payer: Self-pay

## 2021-07-30 DIAGNOSIS — I214 Non-ST elevation (NSTEMI) myocardial infarction: Secondary | ICD-10-CM | POA: Diagnosis not present

## 2021-07-30 LAB — BASIC METABOLIC PANEL
Anion gap: 7 (ref 5–15)
BUN: 6 mg/dL (ref 6–20)
CO2: 23 mmol/L (ref 22–32)
Calcium: 8.5 mg/dL — ABNORMAL LOW (ref 8.9–10.3)
Chloride: 105 mmol/L (ref 98–111)
Creatinine, Ser: 0.86 mg/dL (ref 0.44–1.00)
GFR, Estimated: >60 mL/min (ref >60)
Glucose, Bld: 194 mg/dL — ABNORMAL HIGH (ref 70–99)
Potassium: 4.3 mmol/L (ref 3.5–5.1)
Sodium: 135 mmol/L (ref 135–145)

## 2021-07-30 MED ORDER — BUTALBITAL-APAP-CAFFEINE 50-325-40 MG PO TABS
1.0000 | ORAL_TABLET | Freq: Two times a day (BID) | ORAL | Status: DC | PRN
  Administered 2021-07-31 (×2): 1 via ORAL
  Filled 2021-07-30 (×2): qty 1

## 2021-07-30 MED ORDER — BUTALBITAL-APAP-CAFFEINE 50-325-40 MG PO TABS
2.0000 | ORAL_TABLET | Freq: Once | ORAL | Status: AC
  Administered 2021-07-30: 2 via ORAL
  Filled 2021-07-30: qty 2

## 2021-07-30 MED ORDER — RANOLAZINE ER 500 MG PO TB12
500.0000 mg | ORAL_TABLET | Freq: Two times a day (BID) | ORAL | Status: DC
  Administered 2021-07-30 – 2021-07-31 (×3): 500 mg via ORAL
  Filled 2021-07-30 (×3): qty 1

## 2021-07-30 NOTE — Progress Notes (Addendum)
Pt complain of  6/10 chest pain, left arm numbness and headache.   ECG done. 1x Nitro given. Vitals taken. 2L O2 on. Notified provider.

## 2021-07-30 NOTE — TOC Benefit Eligibility Note (Signed)
Patient Product/process development scientist completed.    The patient is currently admitted and upon discharge could be taking ranolazine (Ranexa) 500mg  12hr tablet.  The current 30 day co-pay is, $15.00.   The patient is insured through   [employer], CPhT Pharmacy Patient Advocate Specialist [place] [facility] Stewardship Team Direct Number: [phone]  Fax: [phone]
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
## 2021-07-30 NOTE — Progress Notes (Signed)
CARDIAC REHAB PHASE I   PRE:  Rate/Rhythm: 65 SR    BP: sitting 130/82    SaO2: 100 2L, 97 RA  MODE:  Ambulation: 470 ft   POST:  Rate/Rhythm: 93 SR    BP: sitting 133/81     SaO2: 91 RA, up to 94 RA  Pt endorses 4/10 CP, dull ache, up to 5/10 with deep breath. Able to walk slowly without change in CP. Happy to be able to walk. No orthostasis, d/c'd O2 although SaO2 was briefly down at end of walk.   Reviewed smoking cessation and diet. Gave pt videos to view today. Will change CRPII from [place] to [facility] as pt works in [place] [employer] [place] near hospital. [phone]   Harriet Masson CES, ACSM 07/30/2021 12:01 PM

## 2021-07-30 NOTE — Progress Notes (Signed)
   Notified that patient still complaining of ongoing chest pain. No improvement with Imdur, Ranexa, or Oxycodone this morning. Discussed with Dr. Clifton James. Options are limited. Unable to up-titrate Imdur due to head. We added Ranexa this morning so it may just take a little while for this to help. Unable to add other antianginals due to soft BP at time. Patient does not appear to be in any distress so will continue to monitor at this time. Patient also continuing to complain of 9/10 headache. She was complaining of a headache yesterday as well and was given IV Dexamethasone, Benadryl, and Reglan. She does have a history of migraines and states she has taken Fioricet before. Will order one time dose of this now to see if that helps.  Corrin Parker, PA-C 07/30/2021 2:10 PM

## 2021-07-31 ENCOUNTER — Telehealth (HOSPITAL_COMMUNITY): Payer: Self-pay | Admitting: Pharmacist

## 2021-07-31 ENCOUNTER — Inpatient Hospital Stay (HOSPITAL_COMMUNITY): Payer: 59

## 2021-07-31 DIAGNOSIS — I214 Non-ST elevation (NSTEMI) myocardial infarction: Secondary | ICD-10-CM | POA: Diagnosis not present

## 2021-07-31 LAB — BASIC METABOLIC PANEL
Anion gap: 6 (ref 5–15)
BUN: 8 mg/dL (ref 6–20)
CO2: 28 mmol/L (ref 22–32)
Calcium: 8.8 mg/dL — ABNORMAL LOW (ref 8.9–10.3)
Chloride: 104 mmol/L (ref 98–111)
Creatinine, Ser: 1.01 mg/dL — ABNORMAL HIGH (ref 0.44–1.00)
GFR, Estimated: >60 mL/min (ref >60)
Glucose, Bld: 97 mg/dL (ref 70–99)
Potassium: 3.9 mmol/L (ref 3.5–5.1)
Sodium: 138 mmol/L (ref 135–145)

## 2021-07-31 MED ORDER — ASPIRIN 81 MG PO TBEC: 81.0000 mg | DELAYED_RELEASE_TABLET | Freq: Every day | ORAL | 3 refills | Status: DC

## 2021-07-31 MED ORDER — ATORVASTATIN CALCIUM 80 MG PO TABS: 80.0000 mg | ORAL_TABLET | Freq: Every day | ORAL | 3 refills | Status: DC

## 2021-07-31 MED ORDER — RANOLAZINE ER 500 MG PO TB12: 500.0000 mg | ORAL_TABLET | Freq: Two times a day (BID) | ORAL | 3 refills | Status: DC

## 2021-07-31 MED ORDER — CLOPIDOGREL BISULFATE 75 MG PO TABS: 75.0000 mg | ORAL_TABLET | Freq: Every day | ORAL | 3 refills | Status: DC

## 2021-07-31 MED ORDER — NICOTINE 21 MG/24HR TD PT24: 21.0000 mg | MEDICATED_PATCH | Freq: Every day | TRANSDERMAL | 0 refills | Status: DC

## 2021-07-31 MED ORDER — ISOSORBIDE MONONITRATE ER 30 MG PO TB24: 30.0000 mg | ORAL_TABLET | Freq: Every day | ORAL | 3 refills | Status: DC

## 2021-07-31 MED ORDER — NITROGLYCERIN 0.4 MG SL SUBL: 0.4000 mg | SUBLINGUAL_TABLET | SUBLINGUAL | 2 refills | Status: DC | PRN

## 2021-07-31 NOTE — Telephone Encounter (Signed)
Hello  Tonya Miller,   The Pharmacy team is conducting a discharge transitions of care quality improvement initiative. The recommendations below are for your consideration.    Tonya Miller is a 55 y.o. female (MRN: [id number], DOB: 08/11/1966) who was recently hospitalized on 07/28/2021 for NSTEMI. They are anticipated to visit your clinic for post-discharge follow-up and may benefit from assistance with medication initiation and/or access.     Their pertinent cardiovascular medications at discharge should include:  aspirin 81 MG EC tablet Take 1 tablet (81 mg total) by mouth daily. Swallow whole.  atorvastatin 80 MG tablet Commonly known as: LIPITOR Take 1 tablet (80 mg total) by mouth daily.  clopidogrel 75 MG tablet Commonly known as: PLAVIX Take 1 tablet (75 mg total) by mouth daily.  isosorbide mononitrate 30 MG 24 hr tablet Commonly known as: IMDUR Take 1 tablet (30 mg total) by mouth daily.  nitroGLYCERIN 0.4 MG SL tablet Commonly known as: NITROSTAT Place 1 tablet (0.4 mg total) under the tongue every 5 (five) minutes as needed for chest pain.  ranolazine 500 MG 12 hr tablet Commonly known as: RANEXA Take 1 tablet (500 mg total) by mouth 2 (two) times daily.    The following medication changes were made: Added: aspirin, plavix, nicotine patch, Imdur, atorvastatin, ranexa,   Discontinued Mimvey (Estradiol/Norethindrone) due to increased risk of stroke and MI She was placed on plavix due to a history of SAH in 2018  Other medication access issues which may benefit from further intervention include:  No access issues identified.    If appropriate, please consider the additional therapy recommendations below:   A beta-blocker was not added due to bradycardia but may be able to consider adding this at her next appointment Her cardiac cath was on 07/28/2021 and plans are for plavix and aspirin for a minimum of 12 months as tolerated Due to a headache Imdur was not increased past  30mg  po daily. Due to continued chest patin on Imdur, Ranexa was added Imdur may need to be discontinued if headaches persist She is willing to try and quite smoking and will be provided nicotine patches at discharge but may need additional support for smoking cessation  We appreciate your assistance with the implementation of these recommendations. Please let know if there is anything we can help you with at this time.       Thank you, [place]
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
## 2021-07-31 NOTE — Progress Notes (Signed)
CARDIAC REHAB PHASE I   PRE:  Rate/Rhythm: 54 SB    BP: lying 95/66    SaO2: 92-94 RA asleep  MODE:  Ambulation: 470 ft   POST:  Rate/Rhythm: 67 SR    BP: sitting 99/70     SaO2: 90 RA, up to 98 RA  Pt awoken. Sts she slept well. Sts CP is 3/10 today. Able to slowly walk hall, no major c/o. No increase in CP. Eager to d/c today. To recliner, VSS. Answered questions.   [id number]  Harriet Masson CES, ACSM 07/31/2021 9:02 AM

## 2021-08-02 ENCOUNTER — Other Ambulatory Visit: Payer: Self-pay

## 2021-08-02 ENCOUNTER — Emergency Department (HOSPITAL_COMMUNITY)
Admission: EM | Admit: 2021-08-02 | Discharge: 2021-08-03 | Disposition: A | Discharge status: Home / Self Care | Payer: 59 | Attending: Physician Assistant | Admitting: Physician Assistant

## 2021-08-02 ENCOUNTER — Encounter (HOSPITAL_COMMUNITY): Payer: Self-pay | Admitting: Emergency Medicine

## 2021-08-02 ENCOUNTER — Telehealth: Payer: Self-pay | Admitting: *Deleted

## 2021-08-02 ENCOUNTER — Ambulatory Visit: Payer: Self-pay

## 2021-08-02 DIAGNOSIS — M25512 Pain in left shoulder: Secondary | ICD-10-CM | POA: Diagnosis not present

## 2021-08-02 DIAGNOSIS — Z5321 Procedure and treatment not carried out due to patient leaving prior to being seen by health care provider: Secondary | ICD-10-CM | POA: Diagnosis not present

## 2021-08-02 DIAGNOSIS — R1110 Vomiting, unspecified: Secondary | ICD-10-CM | POA: Insufficient documentation

## 2021-08-02 DIAGNOSIS — R209 Unspecified disturbances of skin sensation: Secondary | ICD-10-CM | POA: Insufficient documentation

## 2021-08-02 DIAGNOSIS — R0789 Other chest pain: Secondary | ICD-10-CM | POA: Diagnosis not present

## 2021-08-02 LAB — COMPREHENSIVE METABOLIC PANEL
ALT: 17 U/L (ref 0–44)
AST: 20 U/L (ref 15–41)
Albumin: 3.6 g/dL (ref 3.5–5.0)
Alkaline Phosphatase: 66 U/L (ref 38–126)
Anion gap: 12 (ref 5–15)
BUN: 7 mg/dL (ref 6–20)
CO2: 20 mmol/L — ABNORMAL LOW (ref 22–32)
Calcium: 9.6 mg/dL (ref 8.9–10.3)
Chloride: 105 mmol/L (ref 98–111)
Creatinine, Ser: 0.95 mg/dL (ref 0.44–1.00)
GFR, Estimated: >60 mL/min (ref >60)
Glucose, Bld: 113 mg/dL — ABNORMAL HIGH (ref 70–99)
Potassium: 3.4 mmol/L — ABNORMAL LOW (ref 3.5–5.1)
Sodium: 137 mmol/L (ref 135–145)
Total Bilirubin: 0.6 mg/dL (ref 0.3–1.2)
Total Protein: 7.2 g/dL (ref 6.5–8.1)

## 2021-08-02 LAB — CBC WITH DIFFERENTIAL/PLATELET
Abs Immature Granulocytes: 0.1 10*3/uL — ABNORMAL HIGH (ref 0.00–0.07)
Basophils Absolute: 0.1 10*3/uL (ref 0.0–0.1)
Basophils Relative: 1 %
Eosinophils Absolute: 0.1 10*3/uL (ref 0.0–0.5)
Eosinophils Relative: 1 %
HCT: 47.1 % — ABNORMAL HIGH (ref 36.0–46.0)
Hemoglobin: 15.5 g/dL — ABNORMAL HIGH (ref 12.0–15.0)
Immature Granulocytes: 1 %
Lymphocytes Relative: 17 %
Lymphs Abs: 3.4 10*3/uL (ref 0.7–4.0)
MCH: 33.1 pg (ref 26.0–34.0)
MCHC: 32.9 g/dL (ref 30.0–36.0)
MCV: 100.6 fL — ABNORMAL HIGH (ref 80.0–100.0)
Monocytes Absolute: 1.4 10*3/uL — ABNORMAL HIGH (ref 0.1–1.0)
Monocytes Relative: 7 %
Neutro Abs: 14.7 10*3/uL — ABNORMAL HIGH (ref 1.7–7.7)
Neutrophils Relative %: 73 %
Platelets: 293 10*3/uL (ref 150–400)
RBC: 4.68 MIL/uL (ref 3.87–5.11)
RDW: 12.7 % (ref 11.5–15.5)
WBC: 19.9 10*3/uL — ABNORMAL HIGH (ref 4.0–10.5)
nRBC: 0 % (ref 0.0–0.2)

## 2021-08-02 LAB — TROPONIN I (HIGH SENSITIVITY): Troponin I (High Sensitivity): 540 ng/L (ref <18)

## 2021-08-02 LAB — LIPASE, BLOOD: Lipase: 24 U/L (ref 11–51)

## 2021-08-02 NOTE — Telephone Encounter (Signed)
-----   Message from Marcelino Duster, [place] sent at 07/31/2021 12:31 PM EDT ----- Pt will need TOC phone call - discharging today.   Thank you Angie

## 2021-08-02 NOTE — Telephone Encounter (Signed)
Spoke with pt. Pt does verify that she will continue in our [place] office.   Please see below message regarding continued left shoulder blade pain and left arm numbness and heaviness. Pt has not been seen in our office. Cath procedure with Dr. Excell Seltzer and discharged 07/31/21 from [place]. DOD made aware and will send note to Dr. Excell Seltzer and [facility] triage pool per DOD recc.   Transition Care Management Follow-up Telephone Call Date of discharge and from where: 07/31/21 [facility] hospital How have you been since you were released from the hospital? Pt reports continued "severe" pain in left shoulder blade "that has never stopped since I was in the hospital, and I am just not sure what I am supposed to do." Pain is 10/10 and is relieved with sublingual NTG "but pain returns right away." Yesterday 10/16 pt took NTG x 3 with complete shoulder blade pain relief. Last NTG was this morning at 7:30 AM. Pt also reports left arm numbness and feels "heavy". Arm numbness and heaviness "comes and goes" and is present now at time of call. Pt denies shortness of breath and any other associated s/s. Pt does confirm that she has picked up all medications post discharge and is taking Ranexa and Imdur as well. Notified pt I am making MD aware and if s/s worsen before receives a call back or becomes unstable to call 911. Pt voiced understanding.  Any questions or concerns? See above  Items Reviewed: Did the pt receive and understand the discharge instructions provided? Yes  Medications obtained and verified? Yes  Other? No  Any new allergies since your discharge? No  Dietary orders reviewed? Yes Do you have support at home? Yes   Home Care and Equipment/Supplies: Were home health services ordered? not applicable If so, what is the name of the agency?   Has the agency set up a time to come to the patient's home? not applicable Were any new equipment or medical supplies ordered?  N/a What is the name of the  medical supply agency? N/a Were you able to get the supplies/equipment? not applicable Do you have any questions related to the use of the equipment or supplies? N/a  Functional Questionnaire: (I = Independent and D = Dependent) ADLs: I  Bathing/Dressing- I  Meal Prep- I  Eating- I  Maintaining continence- I  Transferring/Ambulation- I  Managing Meds- I  Follow up appointments reviewed:  PCP Hospital f/u appt confirmed? Yes  Pt states that she is calling Dr. Judithann Sheen' office to confirm. Specialist Hospital f/u appt confirmed? Yes  Scheduled to see Cadence Furth, PA on 08/13/21 @ 10:30 AM. Are transportation arrangements needed? No If their condition worsens, is the pt aware to call Cardiologist or go to the Emergency Dept.? Yes Was the patient provided with contact information for the PCP's office or ED? Yes Was to pt encouraged to call back with questions or concerns? Yes

## 2021-08-02 NOTE — ED Triage Notes (Signed)
Pt from home, c/o has been emesis for a few hours.  Had a "heart attack on Wednesday, had two stints placed and was discharged on Saturday.  C/O "left side chest pain/shoulder blade pain and my left arm is numb."

## 2021-08-02 NOTE — Telephone Encounter (Signed)
Spoke with pt and advised per Dr Excell Seltzer ot will need to be seen for further evaluation.  Appointment scheduled with Dr Ladona Ridgel for 08/03/2021 at 930am.  Pt verbalizes understanding and agrees with current plan.

## 2021-08-02 NOTE — Telephone Encounter (Signed)
Pt's son called regarding his mom. Obtained permission from pt to speak to son.   Son and pt are very upset. Pt had a MI last week. Pt had cardiac cath. And was released home.  Pt now is experiencing Chills and hot flashes as well as nausea. Per son pt does not have a fever, but is unsure if thermometer is working properly.  Pt and son wanted to go to ED. I encouraged them to go get mom checked out.

## 2021-08-02 NOTE — ED Notes (Signed)
Pt called multiple times no answer 

## 2021-08-02 NOTE — ED Provider Notes (Signed)
Emergency Medicine Provider Triage Evaluation Note  Tonya Miller , a 55 y.o. female  was evaluated in triage.  Pt complains of chest pain, nv. Mi last week with stent placement. States left shoulder pain and left arm pain is worse than when she was d/c from the hospital  Review of Systems  Positive: Nvd, chest pain, left arm numbness Negative: Urinary sxs  Physical Exam  BP (!) 144/91 (BP Location: Right Arm)   Pulse 93   Temp 97.6 F (36.4 C) (Oral)   Resp (!) 21   LMP  (LMP Unknown)   SpO2 94%  Gen:   Awake, no distress   Resp:  Normal effort  MSK:   Moves extremities without difficulty Other:  Heart with rrr, lungs ctab, dry heaves  Medical Decision Making  Medically screening exam initiated at 7:11 PM.  Appropriate orders placed.  Tonya Miller was informed that the remainder of the evaluation will be completed by another provider, this initial triage assessment does not replace that evaluation, and the importance of remaining in the ED until their evaluation is complete.     Rayne Du 08/02/21 1913    Terrilee Files, MD 08/03/21 1012

## 2021-08-02 NOTE — Telephone Encounter (Signed)
Would add her on to DOD schedule today or tomorrow to assess. Might need a limited follow-up echo. Differential includes post-MI pericarditis but needs formal assessment.

## 2021-08-02 NOTE — Telephone Encounter (Signed)
Reason for Disposition . [1] Caller has URGENT question AND [2] triager unable to answer question  Answer Assessment - Initial Assessment Questions 1. MAIN CONCERN OR SYMPTOM:  "What is your main concern right now?" "What questions do you have?" "What's the main symptom you're worried about?" (e.g., breathing difficulty, ankle swelling, weight gain.)    Nausea, Chills and sweats. 2. ONSET: "When did the  symptoms  start?"     Earlier today 3. BREATHING DIFFICULTY: "Are you having any difficulty breathing?" If Yes, ask: "How bad is it?"  (e.g., none, mild, moderate, severe)   - MILD: No SOB at rest, mild SOB with walking, speaks normally in sentences, able to lie down, no retractions, pulse < 100.   - MODERATE: SOB at rest, SOB with minimal exertion and prefers to sit, cannot lie down flat, speaks in phrases, mild retractions, audible wheezing, pulse 100-120.   - SEVERE: Very SOB at rest, speaks in single words, struggling to breathe, sitting hunched forward, retractions, pulse > 120      No SOB 4. BETTER-SAME-WORSE: "Are you getting better, staying the same, or getting worse compared to the day you were discharged?"     worse 5. HOSPITALIZATION: "How long were you hospitalized?" (e.g., days)     A few days 6. DISCHARGE DATE: "What date were you discharged from the hospital?"     Weds last week 7. DISCHARGE DOCTOR: "Who is the main doctor taking care of you now?"     na 8. DISCHARGE APPOINTMENT: "Have you scheduled a follow-up discharge appointment with your doctor?"     na 9. DISCHARGE MEDICINES: "Did the physician who discharged you order any new medicines for you to use?" If Yes, ask: "Have you filled the prescription and started taking the medicine?"      Yes 10. WEIGHT - DISCHARGE:  "Do you know your weight when you were discharged from the hospital?"        na 11. WEIGHT - TARGET:  "Do you have a target weight?"        na 12. WEIGHT - CURRENT:  "What is your current weight?"         na 13. OTHER SYMPTOMS: "Do you have any other symptoms?" (e.g., depression, weakness)       na 14. O2 SATURATION MONITOR:  "Do you use an oxygen saturation monitor (pulse oximeter) at home?" If Yes, "What is your reading (oxygen level) today?" "What is your usual oxygen saturation reading?" (e.g., 95%)       na  Protocols used: Heart Failure Post-Hospitalization Follow-up Call-A-[facility]

## 2021-08-02 NOTE — Telephone Encounter (Signed)
Attempted to call pt to verify if she would like to continue to be seen in our [place] office or stay in [place].  No answer at this time. Lmtcb.   Pt discharged 07/31/21 from [facility] s/p NSTEMI.   Pt was scheduled to see Cadence Fransico Michael, [place] in [place] office 08/13/21 for cath f/u. Pt also has appt scheduled 08/18/21 with Jacolyn Reedy for hospital follow up - (which is what is reflected on her hospital discharge summary and AVS).   If pt would like to continue in our [place] office we will proceed with TCM call, otherwise will need to send to [place] to set up TCM.  Transition Care Management Unsuccessful Follow-up Telephone Call  Date of discharge and from where:  07/31/21 [facility]   Attempts:  1st Attempt  Reason for unsuccessful TCM follow-up call:  Left voice message to call our office back.

## 2021-08-03 ENCOUNTER — Ambulatory Visit: Payer: 59 | Admitting: Internal Medicine

## 2021-08-03 VITALS — BP 124/70 | HR 67 | Ht 61.0 in | Wt 125.8 lb

## 2021-08-03 DIAGNOSIS — R079 Chest pain, unspecified: Secondary | ICD-10-CM | POA: Diagnosis not present

## 2021-08-03 DIAGNOSIS — I214 Non-ST elevation (NSTEMI) myocardial infarction: Secondary | ICD-10-CM | POA: Diagnosis not present

## 2021-08-03 NOTE — Patient Instructions (Addendum)
Medication Instructions:  Your physician recommends that you continue on your current medications as directed. Please refer to the Current Medication list given to you today.  Labwork: None ordered.  Testing/Procedures: None ordered.  Follow-Up: Your physician wants you to follow-up:  August 18, 2021 at 8:50 am with Herma Carson as previously scheduled.   Any Other Special Instructions Will Be Listed Below (If Applicable).  If you need a refill on your cardiac medications before your next appointment, please call your pharmacy.

## 2021-08-03 NOTE — ED Notes (Signed)
Attempted to Contact pt to return, left voicemail

## 2021-08-03 NOTE — Progress Notes (Signed)
HPI Ms. Minella returns today for an unscheduled visit for evaluation of chest pain. She is a pleasant 55 yo woman with CAD and tobacco abuse who presented with a NSTEMI and mild troponin elevation and underwent PCI of the RCA. She was thought to have a very distal lesion as the culprit but had an 80% narrowing in the RCA. She has had chest pain since. She notes that the medications have helped her symptoms. She denies sob. She has not started back smoking. She denies syncope. No cough. She has undergone CT scanning which demonstrated a lung nodule and enlarged lymph node, neither of which was thought to represent CA. A 6 month repeat CT was recommended.  No Known Allergies   Current Outpatient Medications  Medication Sig Dispense Refill   acetaminophen (TYLENOL) 500 MG tablet Take 1,000 mg by mouth every 6 (six) hours as needed for moderate pain.     aspirin EC 81 MG EC tablet Take 1 tablet (81 mg total) by mouth daily. Swallow whole. 90 tablet 3   atorvastatin (LIPITOR) 80 MG tablet Take 1 tablet (80 mg total) by mouth daily. 90 tablet 3   clopidogrel (PLAVIX) 75 MG tablet Take 1 tablet (75 mg total) by mouth daily. 90 tablet 3   isosorbide mononitrate (IMDUR) 30 MG 24 hr tablet Take 1 tablet (30 mg total) by mouth daily. 90 tablet 3   nicotine (NICODERM CQ - DOSED IN MG/24 HOURS) 21 mg/24hr patch Place 1 patch (21 mg total) onto the skin daily. 28 patch 0   nitroGLYCERIN (NITROSTAT) 0.4 MG SL tablet Place 1 tablet (0.4 mg total) under the tongue every 5 (five) minutes as needed for chest pain. 25 tablet 2   ranolazine (RANEXA) 500 MG 12 hr tablet Take 1 tablet (500 mg total) by mouth 2 (two) times daily. 180 tablet 3   Vitamin D, Ergocalciferol, (DRISDOL) 1.25 MG (50000 UNIT) CAPS capsule Take 50,000 Units by mouth once a week.     No current facility-administered medications for this visit.     Past Medical History:  Diagnosis Date   Anxiety    COPD (chronic obstructive pulmonary  disease) (HCC)    Muscle spasm     ROS:   All systems reviewed and negative except as noted in the HPI.   Past Surgical History:  Procedure Laterality Date   BACK SURGERY     kyphoplasty   BACK SURGERY     CESAREAN SECTION     CORONARY STENT INTERVENTION N/A 07/28/2021   Procedure: CORONARY STENT INTERVENTION;  Surgeon: [name], MD;  Location: [facility] INVASIVE CV LAB;  Service: Cardiovascular;  Laterality: N/A;   LEFT HEART CATH AND CORONARY ANGIOGRAPHY N/A 07/28/2021   Procedure: LEFT HEART CATH AND CORONARY ANGIOGRAPHY;  Surgeon: [name], MD;  Location: [facility] INVASIVE CV LAB;  Service: Cardiovascular;  Laterality: N/A;     Family History  Problem Relation Age of Onset   Heart failure Mother    Heart disease Brother      Social History   Socioeconomic History   Marital status: Legally Separated    Spouse name: Not on file   Number of children: Not on file   Years of education: Not on file   Highest education level: Not on file  Occupational History   Not on file  Tobacco Use   Smoking status: Every Day    Packs/day: 1.00    Types: Cigarettes   Smokeless tobacco: Never  Substance  and Sexual Activity   Alcohol use: No   Drug use: No   Sexual activity: Not on file  Other Topics Concern   Not on file  Social History Narrative   ** Merged History Encounter **       Social Determinants of Health   Financial Resource Strain: Not on file  Food Insecurity: Not on file  Transportation Needs: Not on file  Physical Activity: Not on file  Stress: Not on file  Social Connections: Not on file  Intimate Partner Violence: Not on file     BP 124/70   Pulse 67   Ht 5\' 1"  (1.549 m)   Wt 125 lb 12.8 oz (57.1 kg)   LMP  (LMP Unknown)   SpO2 95%   BMI 23.77 kg/m   Physical Exam:  Well appearing NAD HEENT: Unremarkable Neck:  No JVD, no thyromegally Lymphatics:  No adenopathy Back:  No CVA tenderness Lungs:  Clear with no wheezes HEART:  Regular  rate rhythm, no murmurs, no rubs, no clicks Abd:  soft, positive bowel sounds, no organomegally, no rebound, no guarding Ext:  2 plus pulses, no edema, no cyanosis, no clubbing Skin:  No rashes no nodules Neuro:  CN II through XII intact, motor grossly intact  EKG - nsr with no significant STT changes   Assess/Plan:  CAD - she has ongoing C/P but it is not related to activity. She walked in the office today and had no change in her symptoms.  Insomnia - she is having trouble sleeping due to anxiety. I think that this will improve as time from the MI increases. Tobacco abuse - she is in remission and I cautioned her not to smoke at all.  Dyslipidemia - she will continue her statin.  Johnnell Liou,MD

## 2021-08-10 NOTE — Progress Notes (Signed)
Cardiology Office Note    Date:  08/18/2021   ID:  Tonya Miller, DOB 12/18/1965, MRN [id number]   PCP:  Tonya Arbour, MD   [facility]  Cardiologist:  Verne Carrow, MD   Advanced Practice Provider:  No care team member to display Electrophysiologist:  None   [phone]   Chief Complaint  Patient presents with   Follow-up    History of Present Illness:  Tonya Miller is a 55 y.o. female  with history of HLD, COPD & tobacco abuse, anxiety admitted with NSTEMI and underwent DES prox and mid RCA 07/28/21 with residual nonobstructive CAD in LAD and Cfx. Echo with normal LVEF. Also found to have 1.4 cm pulm nodule on CXR  Patient in ED 08/02/21 with chest pain, troponins 540 coming down from MI(1609 07/28/21)and walked into office and saw Dr. Ladona Ridgel 08/03/21 with atypical chest pain.  Patient comes in for f/u. Doesn't feel well at all. Very anxious. Having chest pain-pulling when she takes a deep breath. Left arm numbness constantly, room spinning last night. 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 No exertional symptoms. She feels like it's anxiety.     Past Medical History:  Diagnosis Date   Anxiety    COPD (chronic obstructive pulmonary disease) (HCC)    Muscle spasm     Past Surgical History:  Procedure Laterality Date   BACK SURGERY     kyphoplasty   BACK SURGERY     CESAREAN SECTION     CORONARY STENT INTERVENTION N/A 07/28/2021   Procedure: CORONARY STENT INTERVENTION;  Surgeon: [name], MD;  Location: [facility] INVASIVE CV LAB;  Service: Cardiovascular;  Laterality: N/A;   LEFT HEART CATH AND CORONARY ANGIOGRAPHY N/A 07/28/2021   Procedure: LEFT HEART CATH AND CORONARY ANGIOGRAPHY;  Surgeon: [name], MD;  Location: [facility] INVASIVE CV LAB;  Service: Cardiovascular;  Laterality: N/A;    Current Medications: Current Meds  Medication Sig   acetaminophen (TYLENOL) 500 MG  tablet Take 1,000 mg by mouth every 6 (six) hours as needed for moderate pain.   aspirin EC 81 MG EC tablet Take 1 tablet (81 mg total) by mouth daily. Swallow whole.   atorvastatin (LIPITOR) 80 MG tablet Take 1 tablet (80 mg total) by mouth daily.   clopidogrel (PLAVIX) 75 MG tablet Take 1 tablet (75 mg total) by mouth daily.   isosorbide mononitrate (IMDUR) 30 MG 24 hr tablet Take 1 tablet (30 mg total) by mouth daily.   nicotine (NICODERM CQ - DOSED IN MG/24 HOURS) 21 mg/24hr patch Place 1 patch (21 mg total) onto the skin daily.   nitroGLYCERIN (NITROSTAT) 0.4 MG SL tablet Place 1 tablet (0.4 mg total) under the tongue every 5 (five) minutes as needed for chest pain.   ranolazine (RANEXA) 500 MG 12 hr tablet Take 1 tablet (500 mg total) by mouth 2 (two) times daily.   Vitamin D, Ergocalciferol, (DRISDOL) 1.25 MG (50000 UNIT) CAPS capsule Take 50,000 Units by mouth once a week.     Allergies:   Patient has no known allergies.   Social History   Socioeconomic History   Marital status: Legally Separated    Spouse name: Not on file   Number of children: Not on file   Years of education: Not on file   Highest education level: Not on file  Occupational History   Not on file  Tobacco Use   Smoking status: Every  Day    Packs/day: 1.00    Types: Cigarettes   Smokeless tobacco: Never  Substance and Sexual Activity   Alcohol use: No   Drug use: No   Sexual activity: Not on file  Other Topics Concern   Not on file  Social History Narrative   ** Merged History Encounter **       Social Determinants of Health   Financial Resource Strain: Not on file  Food Insecurity: Not on file  Transportation Needs: Not on file  Physical Activity: Not on file  Stress: Not on file  Social Connections: Not on file     Family History:  The patient's  family history includes Heart disease in her brother; Heart failure in her mother.   ROS:   Please see the history of present illness.    ROS  All other systems reviewed and are negative.   PHYSICAL EXAM:   VS:  BP (!) 142/74   Pulse 67   Ht 5\' 1"  (1.549 m)   Wt 126 lb 6.4 oz (57.3 kg)   LMP  (LMP Unknown)   SpO2 98%   BMI 23.88 kg/m   Physical Exam  GEN: Thin, in no acute distress  Neck: no JVD, carotid bruits, or masses Cardiac:RRR; no murmurs, rubs, or gallops  Respiratory:  clear to auscultation bilaterally, normal work of breathing GI: soft, nontender, nondistended, + BS Ext: without cyanosis, clubbing, or edema, Good distal pulses bilaterally Neuro:  Alert and Oriented x 3, Psych: euthymic mood, full affect  Wt Readings from Last 3 Encounters:  08/18/21 126 lb 6.4 oz (57.3 kg)  08/03/21 125 lb 12.8 oz (57.1 kg)  07/28/21 125 lb (56.7 kg)      Studies/Labs Reviewed:   EKG:  EKG is  ordered today.  The ekg ordered today demonstrates NSR normal EKG  Recent Labs: 08/02/2021: ALT 17; BUN 7; Creatinine, Ser 0.95; Hemoglobin 15.5; Platelets 293; Potassium 3.4; Sodium 137   Lipid Panel    Component Value Date/Time   CHOL 157 07/29/2021 0707   TRIG 139 07/29/2021 0707   HDL 29 (L) 07/29/2021 0707   CHOLHDL 5.4 07/29/2021 0707   VLDL 28 07/29/2021 0707   LDLCALC 100 (H) 07/29/2021 0707    Additional studies/ records that were reviewed today include:    Echo 07/29/21 IMPRESSIONS     1. Left ventricular ejection fraction, by estimation, is 60 to 65%. The  left ventricle has normal function. The left ventricle has no regional  wall motion abnormalities. Left ventricular diastolic parameters were  normal.   2. Right ventricular systolic function is normal. The right ventricular  size is normal. There is normal pulmonary artery systolic pressure.   3. The mitral valve is normal in structure. Trivial mitral valve  regurgitation. No evidence of mitral stenosis.   4. The aortic valve is normal in structure. Aortic valve regurgitation is  not visualized. No aortic stenosis is present.   5. The inferior vena  cava is normal in size with greater than 50%  respiratory variability, suggesting right atrial pressure of 3 mmHg.   FINDINGS   Left Ventricle: Left ventricular ejection fraction, by estimation, is 60  to 65%. The left ventricle has normal function. The left ventricle has no  regional wall motion abnormalities. The left ventricular internal cavity  size was normal in size. There is   no left ventricular hypertrophy. Left ventricular diastolic parameters  were normal.   Right Ventricle: The right ventricular size is normal.  Right ventricular  systolic function is normal. There is normal pulmonary artery systolic  pressure. The tricuspid regurgitant velocity is 2.32 m/s, and with an  assumed right atrial pressure of 3 mmHg,   the estimated right ventricular systolic pressure is 24.5 mmHg.   Left Atrium: Left atrial size was normal in size.   Right Atrium: Right atrial size was normal in size.   Pericardium: There is no evidence of pericardial effusion.   Mitral Valve: The mitral valve is normal in structure. Trivial mitral  valve regurgitation. No evidence of mitral valve stenosis.   Tricuspid Valve: The tricuspid valve is normal in structure. Tricuspid  valve regurgitation is trivial. No evidence of tricuspid stenosis.   Aortic Valve: The aortic valve is normal in structure. Aortic valve  regurgitation is not visualized. No aortic stenosis is present.   Pulmonic Valve: The pulmonic valve was normal in structure. Pulmonic valve  regurgitation is trivial. No evidence of pulmonic stenosis.   Aorta: The aortic root is normal in size and structure.   Venous: The inferior vena cava is normal in size with greater than 50%  respiratory variability, suggesting right atrial pressure of 3 mmHg.   IAS/Shunts: No atrial level shunt detected by color flow Doppler.    Cath 07/28/21   Prox RCA lesion is 70% stenosed.   Mid RCA lesion is 80% stenosed.   Dist Cx lesion is 70% stenosed.    Prox LAD to Mid LAD lesion is 50% stenosed.   A drug-eluting stent was successfully placed using a STENT ONYX FRONTIER 3.0X18.   A drug-eluting stent was successfully placed using a STENT ONYX FRONTIER 2.5X15.   Post intervention, there is a 0% residual stenosis.   Post intervention, there is a 0% residual stenosis.   The left ventricular systolic function is normal.   The left ventricular ejection fraction is 55-65% by visual estimate.   1.  Nonobstructive LAD stenosis with mild to moderate 40 to 50% stenosis at the first diagonal branch and intramyocardial bridging further down into the mid vessel. 2.  Moderate distal left circumflex stenosis of 70% with evidence of contrast staining in a distal AV segment likely culprit for the patient's non-STEMI 3.  Severe calcific stenosis in the mid RCA and moderately severe calcific stenosis in the proximal RCA, both lesions treated with coronary stenting with resolution of stenosis at each lesion site from 80% to 0 in the mid vessel and 70% to 0 in the proximal vessel. 4.  Normal LVEF by ventriculography, estimated at 55 to 60% with no wall motion abnormalities   Recommendations: Aspirin and ticagrelor x12 months as tolerated, follow cardiac markers, tobacco cessation and risk reduction measures with post MI medical therapy.    Risk Assessment/Calculations:         ASSESSMENT:    1. Coronary artery disease involving native coronary artery of native heart without angina pectoris   2. Hyperlipidemia, unspecified hyperlipidemia type   3. Tobacco abuse   4. Anxiety      PLAN:  In order of problems listed above:  CAD NSTEMI 07/28/21 DES prox and mid RCA with residual nonobstructive CAD in LAD and Cfx with recurrent atypical chest pain-EKG normal, symptoms atypical. A lot of anxiety. Recommend referral to psychiatry but she wants to think about. 150 min exercise weekly  HLD LDL 100 on lipitor  Tobacco abuse-not smoking on nicotine  Anxiety  asking for meds but told her she needs to get from PCP. Recommend referral to psych  but she wants to think about.  Shared Decision Making/Informed Consent        Medication Adjustments/Labs and Tests Ordered: Current medicines are reviewed at length with the patient today.  Concerns regarding medicines are outlined above.  Medication changes, Labs and Tests ordered today are listed in the Patient Instructions below. Patient Instructions  Medication Instructions:  Your physician recommends that you continue on your current medications as directed. Please refer to the Current Medication list given to you today.  *If you need a refill on your cardiac medications before your next appointment, please call your pharmacy*   Lab Work: None  If you have labs (blood work) drawn today and your tests are completely normal, you will receive your results only by: MyChart Message (if you have MyChart) OR A paper copy in the mail If you have any lab test that is abnormal or we need to change your treatment, we will call you to review the results.   Follow-Up: At [facility], you and your health needs are our priority.  As part of our continuing mission to provide you with exceptional heart care, we have created designated Provider Care Teams.  These Care Teams include your primary Cardiologist (physician) and Advanced Practice Providers (APPs -  Physician Assistants and Nurse Practitioners) who all work together to provide you with the care you need, when you need it.   Your next appointment:   2-3 month(s)  The format for your next appointment:   In Person  Provider:   You may see Verne Carrow, MD or one of the following Advanced Practice Providers on your designated Care Team:   Ronie Spies, PA-C Jacolyn Reedy, PA-C     Signed, Jacolyn Reedy, PA-C  08/18/2021 9:11 AM    [facility] [address] [place], [place], [place]  [address] Phone: [phone]; Fax: [phone]

## 2021-08-13 ENCOUNTER — Ambulatory Visit: Payer: 59 | Admitting: Medical

## 2021-08-18 ENCOUNTER — Encounter: Payer: Self-pay | Admitting: Physician Assistant

## 2021-08-18 ENCOUNTER — Other Ambulatory Visit: Payer: Self-pay

## 2021-08-18 ENCOUNTER — Ambulatory Visit: Payer: 59 | Admitting: Physician Assistant

## 2021-08-18 VITALS — BP 142/74 | HR 67 | Ht 61.0 in | Wt 126.4 lb

## 2021-08-18 DIAGNOSIS — Z720 Tobacco use: Secondary | ICD-10-CM | POA: Diagnosis not present

## 2021-08-18 DIAGNOSIS — E785 Hyperlipidemia, unspecified: Secondary | ICD-10-CM | POA: Diagnosis not present

## 2021-08-18 DIAGNOSIS — I2510 Atherosclerotic heart disease of native coronary artery without angina pectoris: Secondary | ICD-10-CM

## 2021-08-18 DIAGNOSIS — F419 Anxiety disorder, unspecified: Secondary | ICD-10-CM | POA: Diagnosis not present

## 2021-08-18 NOTE — Patient Instructions (Signed)
Medication Instructions:  Your physician recommends that you continue on your current medications as directed. Please refer to the Current Medication list given to you today.  *If you need a refill on your cardiac medications before your next appointment, please call your pharmacy*   Lab Work: None  If you have labs (blood work) drawn today and your tests are completely normal, you will receive your results only by: MyChart Message (if you have MyChart) OR A paper copy in the mail If you have any lab test that is abnormal or we need to change your treatment, we will call you to review the results.   Follow-Up: At [facility], you and your health needs are our priority.  As part of our continuing mission to provide you with exceptional heart care, we have created designated Provider Care Teams.  These Care Teams include your primary Cardiologist (physician) and Advanced Practice Providers (APPs -  Physician Assistants and Nurse Practitioners) who all work together to provide you with the care you need, when you need it.   Your next appointment:   2-3 month(s)  The format for your next appointment:   In Person  Provider:   You may see Verne Carrow, MD or one of the following Advanced Practice Providers on your designated Care Team:   Ronie Spies, PA-C Jacolyn Reedy, PA-C

## 2021-11-16 NOTE — Progress Notes (Deleted)
Cardiology Office Note    Date:  11/16/2021   ID:  Tonya Miller, DOB December 16, 1965, MRN [id number]   PCP:  Marguarite Arbour, MD   [facility]  Cardiologist:  Verne Carrow, MD *** Advanced Practice Provider:  No care team member to display Electrophysiologist:  None   [phone]   No chief complaint on file.   History of Present Illness:  Tonya Miller is a 56 y.o. female with history of HLD, COPD & tobacco abuse, anxiety admitted with NSTEMI and underwent DES prox and mid RCA 07/28/21 with residual nonobstructive CAD in LAD and Cfx. Echo with normal LVEF. Also found to have 1.4 cm pulm nodule on CXR   Patient in ED 08/02/21 with chest pain, troponins 540 coming down from MI(1609 07/28/21)and walked into office and saw Dr. Ladona Ridgel 08/03/21 with atypical chest pain.  I saw the patient 08/18/2021 with atypical chest pain and a lot of stress and anxiety.  I recommend she see psychiatry but she wanted to hold off      Past Medical History:  Diagnosis Date   Anxiety    COPD (chronic obstructive pulmonary disease) (HCC)    Muscle spasm     Past Surgical History:  Procedure Laterality Date   BACK SURGERY     kyphoplasty   BACK SURGERY     CESAREAN SECTION     CORONARY STENT INTERVENTION N/A 07/28/2021   Procedure: CORONARY STENT INTERVENTION;  Surgeon: [name], MD;  Location: [facility] INVASIVE CV LAB;  Service: Cardiovascular;  Laterality: N/A;   LEFT HEART CATH AND CORONARY ANGIOGRAPHY N/A 07/28/2021   Procedure: LEFT HEART CATH AND CORONARY ANGIOGRAPHY;  Surgeon: [name], MD;  Location: [facility] INVASIVE CV LAB;  Service: Cardiovascular;  Laterality: N/A;    Current Medications: No outpatient medications have been marked as taking for the 11/24/21 encounter (Appointment) with Dyann Kief, PA-C.     Allergies:   Patient has no known allergies.   Social History   Socioeconomic History   Marital status: Legally Separated    Spouse  name: Not on file   Number of children: Not on file   Years of education: Not on file   Highest education level: Not on file  Occupational History   Not on file  Tobacco Use   Smoking status: Every Day    Packs/day: 1.00    Types: Cigarettes   Smokeless tobacco: Never  Substance and Sexual Activity   Alcohol use: No   Drug use: No   Sexual activity: Not on file  Other Topics Concern   Not on file  Social History Narrative   ** Merged History Encounter **       Social Determinants of Health   Financial Resource Strain: Not on file  Food Insecurity: Not on file  Transportation Needs: Not on file  Physical Activity: Not on file  Stress: Not on file  Social Connections: Not on file     Family History:  The patient's ***family history includes Heart disease in her brother; Heart failure in her mother.   ROS:   Please see the history of present illness.    ROS All other systems reviewed and are negative.   PHYSICAL EXAM:   VS:  LMP  (LMP Unknown)   Physical Exam  GEN: Well nourished, well developed, in no acute distress  HEENT: normal  Neck: no JVD, carotid bruits, or masses Cardiac:RRR; no murmurs, rubs, or gallops  Respiratory:  clear to auscultation bilaterally, normal work of breathing GI: soft, nontender, nondistended, + BS Ext: without cyanosis, clubbing, or edema, Good distal pulses bilaterally MS: no deformity or atrophy  Skin: warm and dry, no rash Neuro:  Alert and Oriented x 3, Strength and sensation are intact Psych: euthymic mood, full affect  Wt Readings from Last 3 Encounters:  08/18/21 126 lb 6.4 oz (57.3 kg)  08/03/21 125 lb 12.8 oz (57.1 kg)  07/28/21 125 lb (56.7 kg)      Studies/Labs Reviewed:   EKG:  EKG is*** ordered today.  The ekg ordered today demonstrates ***  Recent Labs: 08/02/2021: ALT 17; BUN 7; Creatinine, Ser 0.95; Hemoglobin 15.5; Platelets 293; Potassium 3.4; Sodium 137   Lipid Panel    Component Value Date/Time   CHOL  157 07/29/2021 0707   TRIG 139 07/29/2021 0707   HDL 29 (L) 07/29/2021 0707   CHOLHDL 5.4 07/29/2021 0707   VLDL 28 07/29/2021 0707   LDLCALC 100 (H) 07/29/2021 0707    Additional studies/ records that were reviewed today include:  Echo 07/29/21 IMPRESSIONS     1. Left ventricular ejection fraction, by estimation, is 60 to 65%. The  left ventricle has normal function. The left ventricle has no regional  wall motion abnormalities. Left ventricular diastolic parameters were  normal.   2. Right ventricular systolic function is normal. The right ventricular  size is normal. There is normal pulmonary artery systolic pressure.   3. The mitral valve is normal in structure. Trivial mitral valve  regurgitation. No evidence of mitral stenosis.   4. The aortic valve is normal in structure. Aortic valve regurgitation is  not visualized. No aortic stenosis is present.   5. The inferior vena cava is normal in size with greater than 50%  respiratory variability, suggesting right atrial pressure of 3 mmHg.   FINDINGS   Left Ventricle: Left ventricular ejection fraction, by estimation, is 60  to 65%. The left ventricle has normal function. The left ventricle has no  regional wall motion abnormalities. The left ventricular internal cavity  size was normal in size. There is   no left ventricular hypertrophy. Left ventricular diastolic parameters  were normal.   Right Ventricle: The right ventricular size is normal. Right ventricular  systolic function is normal. There is normal pulmonary artery systolic  pressure. The tricuspid regurgitant velocity is 2.32 m/s, and with an  assumed right atrial pressure of 3 mmHg,   the estimated right ventricular systolic pressure is 123456 mmHg.   Left Atrium: Left atrial size was normal in size.   Right Atrium: Right atrial size was normal in size.   Pericardium: There is no evidence of pericardial effusion.   Mitral Valve: The mitral valve is normal in  structure. Trivial mitral  valve regurgitation. No evidence of mitral valve stenosis.   Tricuspid Valve: The tricuspid valve is normal in structure. Tricuspid  valve regurgitation is trivial. No evidence of tricuspid stenosis.   Aortic Valve: The aortic valve is normal in structure. Aortic valve  regurgitation is not visualized. No aortic stenosis is present.   Pulmonic Valve: The pulmonic valve was normal in structure. Pulmonic valve  regurgitation is trivial. No evidence of pulmonic stenosis.   Aorta: The aortic root is normal in size and structure.   Venous: The inferior vena cava is normal in size with greater than 50%  respiratory variability, suggesting right atrial pressure of 3 mmHg.   IAS/Shunts: No atrial level shunt detected by color flow  Doppler.    Cath 07/28/21   Prox RCA lesion is 70% stenosed.   Mid RCA lesion is 80% stenosed.   Dist Cx lesion is 70% stenosed.   Prox LAD to Mid LAD lesion is 50% stenosed.   A drug-eluting stent was successfully placed using a STENT ONYX FRONTIER 3.0X18.   A drug-eluting stent was successfully placed using a STENT ONYX FRONTIER 2.5X15.   Post intervention, there is a 0% residual stenosis.   Post intervention, there is a 0% residual stenosis.   The left ventricular systolic function is normal.   The left ventricular ejection fraction is 55-65% by visual estimate.   1.  Nonobstructive LAD stenosis with mild to moderate 40 to 50% stenosis at the first diagonal branch and intramyocardial bridging further down into the mid vessel. 2.  Moderate distal left circumflex stenosis of 70% with evidence of contrast staining in a distal AV segment likely culprit for the patient's non-STEMI 3.  Severe calcific stenosis in the mid RCA and moderately severe calcific stenosis in the proximal RCA, both lesions treated with coronary stenting with resolution of stenosis at each lesion site from 80% to 0 in the mid vessel and 70% to 0 in the proximal  vessel. 4.  Normal LVEF by ventriculography, estimated at 55 to 60% with no wall motion abnormalities   Recommendations: Aspirin and ticagrelor x12 months as tolerated, follow cardiac markers, tobacco cessation and risk reduction measures with post MI medical therapy.       Risk Assessment/Calculations:   {Does this patient have ATRIAL FIBRILLATION?:[phone]}     ASSESSMENT:    No diagnosis found.   PLAN:  In order of problems listed above:  CAD NSTEMI 07/28/21 DES prox and mid RCA with residual nonobstructive CAD in LAD and Cfx with recurrent atypical chest pain-EKG normal, symptoms atypical. A lot of anxiety. Recommend referral to psychiatry but she wants to think about. 150 min exercise weekly   HLD LDL 100 on lipitor   Tobacco abuse-not smoking on nicotine   Anxiety asking for meds but told her she needs to get from PCP. Recommend referral to psych but she wants to think about.  Shared Decision Making/Informed Consent   {Are you ordering a CV Procedure (e.g. stress test, cath, DCCV, TEE, etc)?   Press F2        :[id number]    Medication Adjustments/Labs and Tests Ordered: Current medicines are reviewed at length with the patient today.  Concerns regarding medicines are outlined above.  Medication changes, Labs and Tests ordered today are listed in the Patient Instructions below. There are no Patient Instructions on file for this visit.   Signed, Ermalinda Barrios, PA-C  11/16/2021 3:11 PM    [facility] [address], [place], [place]  [address] Phone: [phone]; Fax: [phone]

## 2021-11-24 ENCOUNTER — Ambulatory Visit: Payer: 59 | Admitting: Physician Assistant

## 2021-11-30 DIAGNOSIS — F411 Generalized anxiety disorder: Secondary | ICD-10-CM | POA: Insufficient documentation

## 2021-11-30 DIAGNOSIS — E559 Vitamin D deficiency, unspecified: Secondary | ICD-10-CM | POA: Insufficient documentation

## 2021-11-30 DIAGNOSIS — J431 Panlobular emphysema: Secondary | ICD-10-CM | POA: Insufficient documentation

## 2021-12-01 ENCOUNTER — Other Ambulatory Visit: Payer: Self-pay | Admitting: Internal Medicine

## 2021-12-01 ENCOUNTER — Other Ambulatory Visit (HOSPITAL_COMMUNITY): Payer: Self-pay | Admitting: Internal Medicine

## 2021-12-01 DIAGNOSIS — Z0000 Encounter for general adult medical examination without abnormal findings: Secondary | ICD-10-CM

## 2021-12-01 DIAGNOSIS — R911 Solitary pulmonary nodule: Secondary | ICD-10-CM

## 2022-01-27 ENCOUNTER — Other Ambulatory Visit: Payer: Self-pay | Admitting: Internal Medicine

## 2022-01-27 DIAGNOSIS — R911 Solitary pulmonary nodule: Secondary | ICD-10-CM

## 2022-01-28 ENCOUNTER — Ambulatory Visit: Payer: 59

## 2022-01-29 ENCOUNTER — Emergency Department: Payer: 59

## 2022-01-29 ENCOUNTER — Observation Stay
Admission: EM | Admit: 2022-01-29 | Discharge: 2022-01-29 | Discharge status: Against Medical Advice | Payer: 59 | Attending: Internal Medicine | Admitting: Internal Medicine

## 2022-01-29 DIAGNOSIS — J449 Chronic obstructive pulmonary disease, unspecified: Secondary | ICD-10-CM | POA: Diagnosis present

## 2022-01-29 DIAGNOSIS — Z20822 Contact with and (suspected) exposure to covid-19: Secondary | ICD-10-CM | POA: Diagnosis not present

## 2022-01-29 DIAGNOSIS — R0602 Shortness of breath: Secondary | ICD-10-CM | POA: Diagnosis present

## 2022-01-29 DIAGNOSIS — F17210 Nicotine dependence, cigarettes, uncomplicated: Secondary | ICD-10-CM | POA: Insufficient documentation

## 2022-01-29 DIAGNOSIS — J189 Pneumonia, unspecified organism: Secondary | ICD-10-CM | POA: Diagnosis not present

## 2022-01-29 DIAGNOSIS — E876 Hypokalemia: Secondary | ICD-10-CM | POA: Diagnosis not present

## 2022-01-29 DIAGNOSIS — Z720 Tobacco use: Secondary | ICD-10-CM | POA: Diagnosis present

## 2022-01-29 DIAGNOSIS — Z7982 Long term (current) use of aspirin: Secondary | ICD-10-CM | POA: Insufficient documentation

## 2022-01-29 DIAGNOSIS — Z7902 Long term (current) use of antithrombotics/antiplatelets: Secondary | ICD-10-CM | POA: Diagnosis not present

## 2022-01-29 DIAGNOSIS — I25118 Atherosclerotic heart disease of native coronary artery with other forms of angina pectoris: Secondary | ICD-10-CM | POA: Diagnosis not present

## 2022-01-29 DIAGNOSIS — Z79899 Other long term (current) drug therapy: Secondary | ICD-10-CM | POA: Insufficient documentation

## 2022-01-29 LAB — BASIC METABOLIC PANEL
Anion gap: 8 (ref 5–15)
BUN: 11 mg/dL (ref 6–20)
CO2: 26 mmol/L (ref 22–32)
Calcium: 8.7 mg/dL — ABNORMAL LOW (ref 8.9–10.3)
Chloride: 101 mmol/L (ref 98–111)
Creatinine, Ser: 0.9 mg/dL (ref 0.44–1.00)
GFR, Estimated: >60 mL/min (ref >60)
Glucose, Bld: 120 mg/dL — ABNORMAL HIGH (ref 70–99)
Potassium: 2.9 mmol/L — ABNORMAL LOW (ref 3.5–5.1)
Sodium: 135 mmol/L (ref 135–145)

## 2022-01-29 LAB — URINALYSIS, ROUTINE W REFLEX MICROSCOPIC
Bilirubin Urine: NEGATIVE
Glucose, UA: NEGATIVE mg/dL
Hgb urine dipstick: NEGATIVE
Ketones, ur: NEGATIVE mg/dL
Leukocytes,Ua: NEGATIVE
Nitrite: NEGATIVE
Protein, ur: NEGATIVE mg/dL
Specific Gravity, Urine: 1.005 (ref 1.005–1.030)
pH: 6 (ref 5.0–8.0)

## 2022-01-29 LAB — RESP PANEL BY RT-PCR (FLU A&B, COVID) ARPGX2
Influenza A by PCR: NEGATIVE
Influenza B by PCR: NEGATIVE
SARS Coronavirus 2 by RT PCR: NEGATIVE

## 2022-01-29 LAB — CBC
HCT: 37.8 % (ref 36.0–46.0)
Hemoglobin: 12.8 g/dL (ref 12.0–15.0)
MCH: 33.2 pg (ref 26.0–34.0)
MCHC: 33.9 g/dL (ref 30.0–36.0)
MCV: 97.9 fL (ref 80.0–100.0)
Platelets: 180 10*3/uL (ref 150–400)
RBC: 3.86 MIL/uL — ABNORMAL LOW (ref 3.87–5.11)
RDW: 12.1 % (ref 11.5–15.5)
WBC: 12.1 10*3/uL — ABNORMAL HIGH (ref 4.0–10.5)
nRBC: 0 % (ref 0.0–0.2)

## 2022-01-29 LAB — TROPONIN I (HIGH SENSITIVITY): Troponin I (High Sensitivity): 6 ng/L (ref <18)

## 2022-01-29 MED ORDER — ACETAMINOPHEN 325 MG PO TABS: 650.0000 mg | ORAL_TABLET | Freq: Four times a day (QID) | ORAL | Status: DC | PRN

## 2022-01-29 MED ORDER — MORPHINE SULFATE (PF) 2 MG/ML IV SOLN: 2.0000 mg | INTRAVENOUS | Status: DC | PRN

## 2022-01-29 MED ORDER — HYDROCODONE-ACETAMINOPHEN 5-325 MG PO TABS: 1.0000 | ORAL_TABLET | ORAL | Status: DC | PRN

## 2022-01-29 MED ORDER — POLYETHYLENE GLYCOL 3350 17 G PO PACK: 17.0000 g | PACK | Freq: Every day | ORAL | Status: DC | PRN

## 2022-01-29 MED ORDER — SODIUM CHLORIDE 0.9 % IV SOLN
1.0000 g | Freq: Once | INTRAVENOUS | Status: AC
  Administered 2022-01-29: 1 g via INTRAVENOUS
  Filled 2022-01-29: qty 10

## 2022-01-29 MED ORDER — AMOXICILLIN 500 MG PO CAPS: 1000.0000 mg | ORAL_CAPSULE | Freq: Three times a day (TID) | ORAL | 0 refills | Status: AC

## 2022-01-29 MED ORDER — SODIUM CHLORIDE 0.9 % IV SOLN
500.0000 mg | Freq: Once | INTRAVENOUS | Status: AC
  Administered 2022-01-29: 500 mg via INTRAVENOUS
  Filled 2022-01-29: qty 5

## 2022-01-29 MED ORDER — ENOXAPARIN SODIUM 40 MG/0.4ML IJ SOSY: 40.0000 mg | PREFILLED_SYRINGE | INTRAMUSCULAR | Status: DC

## 2022-01-29 MED ORDER — ALBUTEROL SULFATE HFA 108 (90 BASE) MCG/ACT IN AERS: 2.0000 | INHALATION_SPRAY | Freq: Four times a day (QID) | RESPIRATORY_TRACT | 0 refills | Status: DC | PRN

## 2022-01-29 MED ORDER — HYDRALAZINE HCL 20 MG/ML IJ SOLN: 5.0000 mg | INTRAMUSCULAR | Status: DC | PRN

## 2022-01-29 MED ORDER — SODIUM CHLORIDE 0.9 % IV SOLN: 500.0000 mg | INTRAVENOUS | Status: DC

## 2022-01-29 MED ORDER — ACETAMINOPHEN 325 MG RE SUPP: 650.0000 mg | Freq: Four times a day (QID) | RECTAL | Status: DC | PRN

## 2022-01-29 MED ORDER — NICOTINE 21 MG/24HR TD PT24: 21.0000 mg | MEDICATED_PATCH | Freq: Every day | TRANSDERMAL | Status: DC

## 2022-01-29 MED ORDER — DOCUSATE SODIUM 100 MG PO CAPS: 100.0000 mg | ORAL_CAPSULE | Freq: Two times a day (BID) | ORAL | Status: DC

## 2022-01-29 MED ORDER — AZITHROMYCIN 250 MG PO TABS: ORAL_TABLET | ORAL | 1 refills | Status: DC

## 2022-01-29 MED ORDER — BISACODYL 5 MG PO TBEC: 5.0000 mg | DELAYED_RELEASE_TABLET | Freq: Every day | ORAL | Status: DC | PRN

## 2022-01-29 MED ORDER — SODIUM CHLORIDE 0.9 % IV SOLN: INTRAVENOUS | Status: DC

## 2022-01-29 MED ORDER — POTASSIUM CHLORIDE CRYS ER 20 MEQ PO TBCR
40.0000 meq | EXTENDED_RELEASE_TABLET | Freq: Once | ORAL | Status: AC
  Administered 2022-01-29: 40 meq via ORAL
  Filled 2022-01-29: qty 2

## 2022-01-29 MED ORDER — ALBUTEROL SULFATE (2.5 MG/3ML) 0.083% IN NEBU: 2.5000 mg | INHALATION_SOLUTION | RESPIRATORY_TRACT | Status: DC | PRN

## 2022-01-29 MED ORDER — GUAIFENESIN ER 600 MG PO TB12: 600.0000 mg | ORAL_TABLET | Freq: Two times a day (BID) | ORAL | Status: DC | PRN

## 2022-01-29 MED ORDER — SODIUM CHLORIDE 0.9 % IV SOLN: 2.0000 g | INTRAVENOUS | Status: DC

## 2022-01-29 NOTE — H&P (Signed)
?History and Physical  ? ? ?Patient: Tonya Miller [id number] DOB: 07-Jun-1966 ?DOA: 01/29/2022 ?DOS: the patient was seen and examined on 01/29/2022 ?PCP: Idelle Crouch, MD  ?Patient coming from: Home ? ?Chief Complaint:  ?Chief Complaint  ?Patient presents with  ? Chest Pain  ? ?HPI: Tonya Miller is a 56 y.o. female with medical history significant of midsternal chest pain that started about 2 days ago. And now she has SOB that started today.  ? ? ?Duration:2 days . ? ?Frequency: intermittent  ? ?Location:middle of chest . ? ?Quality:sharp.  ? ?Rate:10/10 ? ?Radiation:NR  ? ?Aggravating:supine and taking deep breath/ worse at night.  ? ?Alleviating:sitting upright.  ? ?Associated factors: ?Sob/ fever  of 101 started Tuesday.  ? ?Review of Systems: Review of Systems  ?Respiratory:  Positive for shortness of breath.   ?Cardiovascular:  Positive for chest pain.  ?All other systems reviewed and are negative. ? ?Past Medical History:  ?Diagnosis Date  ? Anxiety   ? COPD (chronic obstructive pulmonary disease) ([place])   ? Muscle spasm   ? ?Past Surgical History:  ?Procedure Laterality Date  ? BACK SURGERY    ? kyphoplasty  ? BACK SURGERY    ? CESAREAN SECTION    ? CORONARY STENT INTERVENTION N/A 07/28/2021  ? Procedure: CORONARY STENT INTERVENTION;  Surgeon: Sherren Mocha, MD;  Location: [place] CV LAB;  Service: Cardiovascular;  Laterality: N/A;  ? LEFT HEART CATH AND CORONARY ANGIOGRAPHY N/A 07/28/2021  ? Procedure: LEFT HEART CATH AND CORONARY ANGIOGRAPHY;  Surgeon: Sherren Mocha, MD;  Location: [facility];  Service: Cardiovascular;  Laterality: N/A;  ? ?Social History:  reports that she has been smoking cigarettes. She has been smoking an average of 1 pack per day. She has never used smokeless tobacco. She reports that she does not drink alcohol and does not use drugs. ? ?No Known Allergies ? ?Family History  ?Problem Relation Age of Onset  ? Heart failure Mother   ? Heart disease Brother    ? ? ?Prior to Admission medications   ?Medication Sig Start Date End Date Taking? Authorizing Provider  ?acetaminophen (TYLENOL) 500 MG tablet Take 1,000 mg by mouth every 6 (six) hours as needed for moderate pain.    [provider]  ?aspirin EC 81 MG EC tablet Take 1 tablet (81 mg total) by mouth daily. Swallow whole. 08/01/21   [facility], Tami Lin, PA  ?atorvastatin (LIPITOR) 80 MG tablet Take 1 tablet (80 mg total) by mouth daily. 08/01/21   Ledora Bottcher, PA  ?clopidogrel (PLAVIX) 75 MG tablet Take 1 tablet (75 mg total) by mouth daily. 08/01/21   [facility], Tami Lin, PA  ?isosorbide mononitrate (IMDUR) 30 MG 24 hr tablet Take 1 tablet (30 mg total) by mouth daily. 08/01/21   [facility], Tami Lin, PA  ?nicotine (NICODERM CQ - DOSED IN MG/24 HOURS) 21 mg/24hr patch Place 1 patch (21 mg total) onto the skin daily. 08/01/21   [facility], Tami Lin, PA  ?nitroGLYCERIN (NITROSTAT) 0.4 MG SL tablet Place 1 tablet (0.4 mg total) under the tongue every 5 (five) minutes as needed for chest pain. 07/31/21   [facility], Tami Lin, PA  ?ranolazine (RANEXA) 500 MG 12 hr tablet Take 1 tablet (500 mg total) by mouth 2 (two) times daily. 07/31/21   Ledora Bottcher, PA  ?Vitamin D, Ergocalciferol, (DRISDOL) 1.25 MG (50000 UNIT) CAPS capsule Take 50,000 Units by mouth once a week. 07/13/21   [provider]  ? ? ?  Physical Exam: ?Vitals:  ? 01/29/22 1828  ?BP: (!) 145/71  ?Pulse: 84  ?Resp: 18  ?Temp: 99.9 ?F (37.7 ?C)  ?TempSrc: Oral  ?SpO2: 94%  ?Weight: 54.4 kg  ?Height: 5\' 1"  (1.549 m)  ?Physical Exam ?Vitals and nursing note reviewed.  ?Constitutional:   ?   General: She is not in acute distress. ?   Appearance: Normal appearance. She is not ill-appearing, toxic-appearing or diaphoretic.  ?HENT:  ?   Head: Normocephalic and atraumatic.  ?   Right Ear: Hearing and external ear normal.  ?   Left Ear: Hearing and external ear normal.  ?   Nose: Nose normal. No nasal deformity.  ?   Mouth/Throat:  ?    Lips: Pink.  ?   Mouth: Mucous membranes are moist.  ?   Tongue: No lesions.  ?   Pharynx: Oropharynx is clear.  ?Eyes:  ?   Extraocular Movements: Extraocular movements intact.  ?   Pupils: Pupils are equal, round, and reactive to light.  ?Neck:  ?   Vascular: No carotid bruit.  ?Cardiovascular:  ?   Rate and Rhythm: Normal rate and regular rhythm.  ?   Pulses: Normal pulses.  ?   Heart sounds: Normal heart sounds.  ?Pulmonary:  ?   Effort: Pulmonary effort is normal.  ?   Breath sounds: Normal breath sounds.  ?Abdominal:  ?   General: Bowel sounds are normal. There is no distension.  ?   Palpations: Abdomen is soft. There is no mass.  ?   Tenderness: There is no abdominal tenderness. There is no guarding.  ?   Hernia: No hernia is present.  ?Musculoskeletal:  ?   Right lower leg: No edema.  ?   Left lower leg: No edema.  ?Skin: ?   General: Skin is warm.  ?Neurological:  ?   General: No focal deficit present.  ?   Mental Status: She is alert and oriented to person, place, and time.  ?   Cranial Nerves: Cranial nerves 2-12 are intact.  ?   Motor: Motor function is intact.  ?Psychiatric:     ?   Attention and Perception: Attention normal.     ?   Mood and Affect: Mood normal.     ?   Speech: Speech normal.     ?   Behavior: Behavior normal. Behavior is cooperative.     ?   Cognition and Memory: Cognition normal.  ? ? ?Data Reviewed: ?Results for orders placed or performed during the hospital encounter of 01/29/22 (from the past 24 hour(s))  ?Basic metabolic panel     Status: Abnormal  ? Collection Time: 01/29/22  6:30 PM  ?Result Value Ref Range  ? Sodium 135 135 - 145 mmol/L  ? Potassium 2.9 (L) 3.5 - 5.1 mmol/L  ? Chloride 101 98 - 111 mmol/L  ? CO2 26 22 - 32 mmol/L  ? Glucose, Bld 120 (H) 70 - 99 mg/dL  ? BUN 11 6 - 20 mg/dL  ? Creatinine, Ser 0.90 0.44 - 1.00 mg/dL  ? Calcium 8.7 (L) 8.9 - 10.3 mg/dL  ? GFR, Estimated >60 >60 mL/min  ? Anion gap 8 5 - 15  ?CBC     Status: Abnormal  ? Collection Time: 01/29/22   6:30 PM  ?Result Value Ref Range  ? WBC 12.1 (H) 4.0 - 10.5 K/uL  ? RBC 3.86 (L) 3.87 - 5.11 MIL/uL  ? Hemoglobin 12.8 12.0 - 15.0 g/dL  ?  HCT 37.8 36.0 - 46.0 %  ? MCV 97.9 80.0 - 100.0 fL  ? MCH 33.2 26.0 - 34.0 pg  ? MCHC 33.9 30.0 - 36.0 g/dL  ? RDW 12.1 11.5 - 15.5 %  ? Platelets 180 150 - 400 K/uL  ? nRBC 0.0 0.0 - 0.2 %  ?Troponin I (High Sensitivity)     Status: None  ? Collection Time: 01/29/22  6:30 PM  ?Result Value Ref Range  ? Troponin I (High Sensitivity) 6 <18 ng/L  ?Urinalysis, Routine w reflex microscopic     Status: Abnormal  ? Collection Time: 01/29/22  7:10 PM  ?Result Value Ref Range  ? Color, Urine STRAW (A) YELLOW  ? APPearance CLEAR (A) CLEAR  ? Specific Gravity, Urine 1.005 1.005 - 1.030  ? pH 6.0 5.0 - 8.0  ? Glucose, UA NEGATIVE NEGATIVE mg/dL  ? Hgb urine dipstick NEGATIVE NEGATIVE  ? Bilirubin Urine NEGATIVE NEGATIVE  ? Ketones, ur NEGATIVE NEGATIVE mg/dL  ? Protein, ur NEGATIVE NEGATIVE mg/dL  ? Nitrite NEGATIVE NEGATIVE  ? Leukocytes,Ua NEGATIVE NEGATIVE  ? ?>> EKG shows sinus rhythm 88 with normal axis, QTc of 447 no ST-T wave changes otherwise. ?>> Chest x-ray done today shows patchy left upper lobe bronchopneumonia. ? ?Assessment and Plan: ?* CAP (community acquired pneumonia) ?Patient presenting with report of pneumonia. ?Chest x-ray shows left upper lobe pneumonia. ?SpO2: 94 % ?Patient's white count is 12.1. ?Patient given Rocephin antibiotic in the emergency room which we will continue. ? ? ?Coronary artery disease of native artery of native heart with stable angina pectoris ([place]) ? ?Pt has heart attack and has 2 stents last October 2022. ?Her chest pain then was different then her episode now. ?No alleviating. No nsaids.  ?We will continue asa 81 mg , Lipitor 80, Imdur SL nitro prn and she is no longer taking ranexa.Marland Kitchen  ?Troponin is 6. ?Pt is stable and no reports of chest pain  ? ?COPD (chronic obstructive pulmonary disease) ([place]) ?Continue with as as needed Ventolin. ? ?Tobacco  abuse ?Nicotine patch.  ?D/w pt about how her smoking is detrimental to her heart stents.  ? ? ?Hypokalemia ?Will replace and follow.  ? ? ?Advance Care Planning:  ?  Code Status: Prior  ? ?Consults:  ?None  ?

## 2022-01-29 NOTE — ED Notes (Signed)
Patient placed on 2 L [place] for o2 sat ?

## 2022-01-29 NOTE — Discharge Instructions (Signed)
Please follow up with your PCP. ?Please have your oxygen level checked and monitored with pulse oximetry at pcp. ?We will give you additional 4 days of antibiotics. ?We will give you inhaler for SOB. ? ?

## 2022-01-29 NOTE — Assessment & Plan Note (Signed)
Continue with as as needed Ventolin. ?

## 2022-01-29 NOTE — ED Triage Notes (Signed)
Patient to ER via [facility] with complaints of non-radiating centralized chest pain that started two days ago. Reports the pain is constant, sharp in nature. Denies shortness of breath now but initially the CP woke her from her sleep and she felt like she had to stand to breath efficiently. Dry cough. Was evaluated at urgent care PTA.  ? ?Patient also with fevers- as high as 102, nausea since Tuesday. ? ? ?

## 2022-01-29 NOTE — Assessment & Plan Note (Addendum)
?  Pt has heart attack and has 2 stents last October 2022. ?Her chest pain then was different then her episode now. ?No alleviating. No nsaids.  ?We will continue asa 81 mg , Lipitor 80, Imdur SL nitro prn and she is no longer taking ranexa.Marland Kitchen  ?Troponin is 6. ?Pt is stable and no reports of chest pain  ?

## 2022-01-29 NOTE — ED Provider Notes (Signed)
? ?[facility] ?Provider Note ? ? ? Event Date/Time  ? First MD Initiated Contact with Patient 01/29/22 1851   ?  (approximate) ? ? ?History  ? ?Chest Pain ? ? ?HPI ? ?Tonya Miller is a 56 y.o. female  who, per PCP noted dated 11/30/2021 has history of CAD, HTN, HLD, who presents to the emergency department today because of concern for chest pain and not feeling well.  Patient states that she first started feeling off 4 days ago.  She developed fevers of 101.  The patient says that she is felt ill.  For the past 2 days she has been having pain in the center part of her chest.  She described it as one-point as a ripping.  It is worse when she takes deep breaths.  Patient denies any recent travel.  Denies any pain or swelling to the legs. ? ?Physical Exam  ? ?Triage Vital Signs: ?ED Triage Vitals [01/29/22 1828]  ?Enc Vitals Group  ?   BP (!) 145/71  ?   Pulse Rate 84  ?   Resp 18  ?   Temp 99.9 ?F (37.7 ?C)  ?   Temp Source Oral  ?   SpO2 94 %  ?   Weight 120 lb (54.4 kg)  ?   Height 5\' 1"  (1.549 m)  ?   Head Circumference   ?   Peak Flow   ?   Pain Score 8  ? ?Most recent vital signs: ?Vitals:  ? 01/29/22 1828  ?BP: (!) 145/71  ?Pulse: 84  ?Resp: 18  ?Temp: 99.9 ?F (37.7 ?C)  ?SpO2: 94%  ? ? ?General: Awake, alert and oriented ?CV:  Good peripheral perfusion. Regular rate and rhythm.  ?Resp:  Normal effort. Clear to auscultation ?Abd:  No distention.  ? ?ED Results / Procedures / Treatments  ? ?Labs ?(all labs ordered are listed, but only abnormal results are displayed) ?Labs Reviewed  ?BASIC METABOLIC PANEL  ?CBC  ?POC URINE PREG, ED  ?TROPONIN I (HIGH SENSITIVITY)  ? ? ? ?EKG ? ?I04/17/23, attending physician, personally viewed and interpreted this EKG ? ?EKG Time: 1825 ?Rate: 88 ?Rhythm: normal sinus rhythm ?Axis: normal ?Intervals: qtc 447 ?QRS: narrow, q waves v1, v2 ?ST changes: no st elevation ?Impression: abnormal ekg ? ?RADIOLOGY ?I independently interpreted and visualized the  CXR. My interpretation: left lung opacity ?Radiology interpretation:  ?IMPRESSION:  ?1. Patchy left upper lobe airspace disease consistent with  ?bronchopneumonia.  ?   ? ? ? ?PROCEDURES: ? ?Critical Care performed: No ? ?Procedures ? ? ?MEDICATIONS ORDERED IN ED: ?Medications - No data to display ? ? ?IMPRESSION / MDM / ASSESSMENT AND PLAN / ED COURSE  ?I reviewed the triage vital signs and the nursing notes. ?             ?               ? ?Differential diagnosis includes, but is not limited to, ACS, PE, pneumothorax, pneumonia, dissection. ? ?Patient presented to the emergency department today because of concerns for chest pain.  She states this has not been accompanied by some fever.  The patient had x-ray performed here which did show left sided pneumonia.  Mild leukocytosis in the blood work.  However while here in the emergency department patient desatted to 88% on room air while sitting in the stretcher.  Because of the hypoxia patient was placed on 2 L nasal cannula.  Discussed concerns for hypoxia and pneumonia with patient.  Discussed with Dr. Allena Katz with the hospitalist service who will plan on admission. ? ?FINAL CLINICAL IMPRESSION(S) / ED DIAGNOSES  ? ?Final diagnoses:  ?Community acquired pneumonia of left lung, unspecified part of lung  ? ? ?Note:  This document was prepared using Dragon voice recognition software and may include unintentional dictation errors. ? ?  ?Phineas Semen, MD ?01/29/22 1956 ? ?

## 2022-01-29 NOTE — Assessment & Plan Note (Signed)
Patient presenting with report of pneumonia. ?Chest x-ray shows left upper lobe pneumonia. ?SpO2: 94 % ?Patient's white count is 12.1. ?Patient given Rocephin antibiotic in the emergency room which we will continue. ? ?

## 2022-01-29 NOTE — ED Notes (Signed)
Ambulatory trial. Tonya Miller sat above 92 while walking. Tonya Miller sat upon sitting back on stretcher dropped to 85 on RA. Tonya requesting a DC to go home. MD Allena Katz will be messaged. ?

## 2022-01-29 NOTE — ED Notes (Signed)
Pt to leave AMA. Dr. Allena Katz aware and would like to send scripts for pt. Pt agreed. [facility]- pharmacy correct in computer. AMA form signed by pt. Aware of risks of leaving without admission to hospital for further testing. Will follow-up with PCP as requested. Ambulatory with steady gait out to front of ED.  ?

## 2022-01-29 NOTE — Assessment & Plan Note (Signed)
Will replace and follow 

## 2022-01-29 NOTE — Assessment & Plan Note (Addendum)
Nicotine patch.  ?D/w pt about how her smoking is detrimental to her heart stents.  ? ?

## 2022-01-29 NOTE — ED Notes (Signed)
Pt up to bedside restroom to urinate ?

## 2022-01-30 ENCOUNTER — Other Ambulatory Visit: Payer: Self-pay | Admitting: Internal Medicine

## 2022-01-30 DIAGNOSIS — J189 Pneumonia, unspecified organism: Secondary | ICD-10-CM

## 2022-01-30 MED ORDER — AZITHROMYCIN 250 MG PO TABS: ORAL_TABLET | ORAL | 0 refills | Status: AC

## 2022-01-30 MED ORDER — AMOXICILLIN 500 MG PO CAPS: 1000.0000 mg | ORAL_CAPSULE | Freq: Three times a day (TID) | ORAL | 0 refills | Status: AC

## 2022-01-30 NOTE — Discharge Summary (Signed)
?Physician Discharge Summary ?  ?Patient: Tonya Miller MRN: [id number] DOB: 02/27/1966  ?Admit date:     01/29/2022  ?Discharge date: 01/29/2022  ?Discharge Physician: Gertha CalkinEkta V Gabrielle Wakeland  ? ?PCP: Marguarite ArbourSparks, Jeffrey D, MD  ? ?Recommendations at discharge:  ? CAP: ?Pt advised to stay due to her hypoxia. ?She decided to leave AMA. ?Antibiotics and Albuterol MDI given with recommendation of following up with pcp ?As early as next day for oxygen check and also to use pulse oximeter to keep check.  ? ?Discharge Diagnoses: ?Principal Problem: ?  CAP (community acquired pneumonia) ?Active Problems: ?  Coronary artery disease of native artery of native heart with stable angina pectoris (HCC) ?  COPD (chronic obstructive pulmonary disease) (HCC) ?  Tobacco abuse ?  Hypokalemia ?  SOB (shortness of breath) ? ?Resolved Problems: ?  * No resolved hospital problems. * ? ?Hospital Course: ?No notes on file ? ?Assessment and Plan: ?* CAP (community acquired pneumonia) ?Patient presenting with report of pneumonia. ?Chest x-ray shows left upper lobe pneumonia. ?SpO2: 94 % ?Patient's white count is 12.1. ?Patient given Rocephin antibiotic in the emergency room which we will continue. ? ? ?Coronary artery disease of native artery of native heart with stable angina pectoris (HCC) ? ?Pt has heart attack and has 2 stents last October 2022. ?Her chest pain then was different then her episode now. ?No alleviating. No nsaids.  ?We will continue asa 81 mg , Lipitor 80, Imdur SL nitro prn and she is no longer taking ranexa.Marland Kitchen.  ?Troponin is 6. ?Pt is stable and no reports of chest pain  ? ?COPD (chronic obstructive pulmonary disease) (HCC) ?Continue with as as needed Ventolin. ? ?Tobacco abuse ?Nicotine patch.  ?D/w pt about how her smoking is detrimental to her heart stents.  ? ? ?Hypokalemia ?Will replace and follow.  ? ?  ?Consultants: none  ?Procedures performed: none   ?Disposition: Home ?Diet recommendation:  ?Discharge Diet Orders (From admission,  onward)  ? ?  Start     Ordered  ? 01/29/22 0000  Diet - low sodium heart healthy       ? 01/29/22 2333  ? ?  ?  ? ?  ? ?Cardiac diet ?DISCHARGE MEDICATION: ?Allergies as of 01/29/2022   ?No Known Allergies ?  ? ?  ?Medication List  ?  ? ?TAKE these medications   ? ?acetaminophen 500 MG tablet ?Commonly known as: TYLENOL ?Take 1,000 mg by mouth every 6 (six) hours as needed for moderate pain. ?  ?albuterol 108 (90 Base) MCG/ACT inhaler ?Commonly known as: VENTOLIN HFA ?Inhale 2 puffs into the lungs every 6 (six) hours as needed for wheezing or shortness of breath. ?  ?amoxicillin 500 MG capsule ?Commonly known as: AMOXIL ?Take 2 capsules (1,000 mg total) by mouth 3 (three) times daily for 5 days. ?  ?aspirin 81 MG EC tablet ?Take 1 tablet (81 mg total) by mouth daily. Swallow whole. ?  ?atorvastatin 80 MG tablet ?Commonly known as: LIPITOR ?Take 1 tablet (80 mg total) by mouth daily. ?  ?busPIRone 10 MG tablet ?Commonly known as: BUSPAR ?Take 10 mg by mouth 2 (two) times daily as needed. ?  ?clopidogrel 75 MG tablet ?Commonly known as: PLAVIX ?Take 1 tablet (75 mg total) by mouth daily. ?  ?isosorbide mononitrate 30 MG 24 hr tablet ?Commonly known as: IMDUR ?Take 1 tablet (30 mg total) by mouth daily. ?  ?nicotine 21 mg/24hr patch ?Commonly known as: NICODERM CQ - dosed in  mg/24 hours ?Place 1 patch (21 mg total) onto the skin daily. ?  ?nitroGLYCERIN 0.4 MG SL tablet ?Commonly known as: NITROSTAT ?Place 1 tablet (0.4 mg total) under the tongue every 5 (five) minutes as needed for chest pain. ?  ?ranolazine 500 MG 12 hr tablet ?Commonly known as: RANEXA ?Take 1 tablet (500 mg total) by mouth 2 (two) times daily. ?  ?Vitamin D (Ergocalciferol) 1.25 MG (50000 UNIT) Caps capsule ?Commonly known as: DRISDOL ?Take 50,000 Units by mouth once a week. ?  ? ?  ? ? ?Discharge Exam: ?Ceasar Mons Weights  ? 01/29/22 1828  ?Weight: 54.4 kg  ?See h/p. ? ?Condition at discharge: stable ? ?The results of significant diagnostics from this  hospitalization (including imaging, microbiology, ancillary and laboratory) are listed below for reference.  ? ?Imaging Studies: ?DG Chest 2 View ? ?Result Date: 01/29/2022 ?CLINICAL DATA:  Nonradiating centralized chest pain for 2 days, short of breath EXAM: CHEST - 2 VIEW COMPARISON:  07/28/2021 FINDINGS: Frontal and lateral views of the chest demonstrate a stable cardiac silhouette. There is patchy airspace disease throughout the left upper lobe, consistent with bronchopneumonia. Stable areas of scarring at the lung bases and within the right upper lobe. No effusion or pneumothorax. Stable compression deformities at the thoracolumbar junction, with evidence of prior vertebral augmentation. IMPRESSION: 1. Patchy left upper lobe airspace disease consistent with bronchopneumonia. Electronically Signed   By: Sharlet Salina M.D.   On: 01/29/2022 19:35   ? ?Microbiology: ?Results for orders placed or performed during the hospital encounter of 01/29/22  ?Resp Panel by RT-PCR (Flu A&B, Covid) Nasopharyngeal Swab     Status: None  ? Collection Time: 01/29/22  8:08 PM  ? Specimen: Nasopharyngeal Swab; Nasopharyngeal(NP) swabs in vial transport medium  ?Result Value Ref Range Status  ? SARS Coronavirus 2 by RT PCR NEGATIVE NEGATIVE Final  ?  Comment: (NOTE) ?SARS-CoV-2 target nucleic acids are NOT DETECTED. ? ?The SARS-CoV-2 RNA is generally detectable in upper respiratory ?specimens during the acute phase of infection. The lowest ?concentration of SARS-CoV-2 viral copies this assay can detect is ?138 copies/mL. A negative result does not preclude SARS-Cov-2 ?infection and should not be used as the sole basis for treatment or ?other patient management decisions. A negative result may occur with  ?improper specimen collection/handling, submission of specimen other ?than nasopharyngeal swab, presence of viral mutation(s) within the ?areas targeted by this assay, and inadequate number of viral ?copies(<138 copies/mL). A  negative result must be combined with ?clinical observations, patient history, and epidemiological ?information. The expected result is Negative. ? ?Fact Sheet for Patients:  ?BloggerCourse.com ? ?Fact Sheet for Healthcare Providers:  ?SeriousBroker.it ? ?This test is no t yet approved or cleared by the [place] FDA and  ?has been authorized for detection and/or diagnosis of SARS-CoV-2 by ?FDA under an Emergency Use Authorization (EUA). This EUA will remain  ?in effect (meaning this test can be used) for the duration of the ?COVID-19 declaration under Section 564(b)(1) of the Act, 21 ?U.S.C.section 360bbb-3(b)(1), unless the authorization is terminated  ?or revoked sooner.  ? ? ?  ? Influenza A by PCR NEGATIVE NEGATIVE Final  ? Influenza B by PCR NEGATIVE NEGATIVE Final  ?  Comment: (NOTE) ?The Xpert Xpress SARS-CoV-2/FLU/RSV plus assay is intended as an aid ?in the diagnosis of influenza from Nasopharyngeal swab specimens and ?should not be used as a sole basis for treatment. Nasal washings and ?aspirates are unacceptable for Xpert Xpress SARS-CoV-2/FLU/RSV ?testing. ? ?Fact Sheet  for Patients: ?BloggerCourse.com ? ?Fact Sheet for Healthcare Providers: ?SeriousBroker.it ? ?This test is not yet approved or cleared by the [place] FDA and ?has been authorized for detection and/or diagnosis of SARS-CoV-2 by ?FDA under an Emergency Use Authorization (EUA). This EUA will remain ?in effect (meaning this test can be used) for the duration of the ?COVID-19 declaration under Section 564(b)(1) of the Act, 21 U.S.C. ?section 360bbb-3(b)(1), unless the authorization is terminated or ?revoked. ? ?Performed at [facility], [address] [place], ?[place] [address] ?  ? ? ?Labs: ?CBC: ?Recent Labs  ?Lab 01/29/22 ?1830  ?WBC 12.1*  ?HGB 12.8  ?HCT 37.8  ?MCV 97.9  ?PLT 180  ? ?Basic Metabolic Panel: ?Recent Labs   ?Lab 01/29/22 ?1830  ?NA 135  ?K 2.9*  ?CL 101  ?CO2 26  ?GLUCOSE 120*  ?BUN 11  ?CREATININE 0.90  ?CALCIUM 8.7*  ? ?Liver Function Tests: ?No results for input(s): AST, ALT, ALKPHOS, BILITOT, PROT, ALBUMIN

## 2022-12-26 ENCOUNTER — Emergency Department: Payer: 59

## 2022-12-26 ENCOUNTER — Other Ambulatory Visit: Payer: Self-pay

## 2022-12-26 ENCOUNTER — Encounter: Payer: Self-pay | Admitting: Emergency Medicine

## 2022-12-26 ENCOUNTER — Emergency Department
Admission: EM | Admit: 2022-12-26 | Discharge: 2022-12-26 | Disposition: A | Discharge status: Home / Self Care | Payer: 59 | Attending: Student in an Organized Health Care Education/Training Program | Admitting: Student in an Organized Health Care Education/Training Program

## 2022-12-26 DIAGNOSIS — W108XXA Fall (on) (from) other stairs and steps, initial encounter: Secondary | ICD-10-CM | POA: Diagnosis not present

## 2022-12-26 DIAGNOSIS — S299XXA Unspecified injury of thorax, initial encounter: Secondary | ICD-10-CM | POA: Diagnosis present

## 2022-12-26 DIAGNOSIS — I2510 Atherosclerotic heart disease of native coronary artery without angina pectoris: Secondary | ICD-10-CM | POA: Insufficient documentation

## 2022-12-26 DIAGNOSIS — R0789 Other chest pain: Secondary | ICD-10-CM

## 2022-12-26 DIAGNOSIS — S20211A Contusion of right front wall of thorax, initial encounter: Secondary | ICD-10-CM | POA: Diagnosis not present

## 2022-12-26 DIAGNOSIS — J449 Chronic obstructive pulmonary disease, unspecified: Secondary | ICD-10-CM | POA: Insufficient documentation

## 2022-12-26 MED ORDER — PREDNISONE 10 MG (21) PO TBPK: ORAL_TABLET | ORAL | 0 refills | Status: DC

## 2022-12-26 MED ORDER — OXYCODONE-ACETAMINOPHEN 5-325 MG PO TABS: 1.0000 | ORAL_TABLET | ORAL | 0 refills | Status: DC | PRN

## 2022-12-26 MED ORDER — LIDOCAINE 5 % EX PTCH
1.0000 | MEDICATED_PATCH | CUTANEOUS | Status: DC
  Administered 2022-12-26: 1 via TRANSDERMAL
  Filled 2022-12-26: qty 1

## 2022-12-26 NOTE — ED Triage Notes (Signed)
Fell down stairs on Saturday. States fell down about 6 steps.  Arrives today c/o right rib pain.

## 2022-12-26 NOTE — Discharge Instructions (Signed)
Apply ice to the right rib.  Use the incentive spirometer at least 3 times daily. Take the steroid and pain medication as needed. Return emergency department if worsening

## 2022-12-26 NOTE — ED Provider Notes (Signed)
[facility] Provider Note    Event Date/Time   First MD Initiated Contact with Patient 12/26/22 [phone]     (approximate)   History   Fall   HPI  Tonya Miller is a 57 y.o. female with history of COPD, NSTEMI, CAD, pulmonary nodule presents emergency department after a fall 2 days ago.  Patient states she was going down her outside steps which are wooden and fell hitting the right ribs.  Denies head injury, any new back pain, or extremity injuries.  Denies chest pain/shortness of breath.  States it hurts more with a deep breath.  Thinks she did break a rib.  States is coughing up white mucus.      Physical Exam   Triage Vital Signs: ED Triage Vitals  Enc Vitals Group     BP 12/26/22 0900 109/72     Pulse Rate 12/26/22 0900 72     Resp 12/26/22 0900 18     Temp 12/26/22 0900 98.4 F (36.9 C)     Temp Source 12/26/22 0900 Oral     SpO2 12/26/22 0900 97 %     Weight 12/26/22 0859 119 lb 14.9 oz (54.4 kg)     Height 12/26/22 0859 '5\' 1"'$  (1.549 m)     Head Circumference --      Peak Flow --      Pain Score 12/26/22 0858 10     Pain Loc --      Pain Edu? --      Excl. in [place]? --     Most recent vital signs: Vitals:   12/26/22 0900 12/26/22 0955  BP: 109/72 110/70  Pulse: 72 70  Resp: 18 18  Temp: 98.4 F (36.9 C)   SpO2: 97% 98%     General: Awake, no distress.   CV:  Good peripheral perfusion. regular rate and  rhythm Resp:  Normal effort. Lungs cta, right rib tender Abd:  No distention.  Nontender Other:      ED Results / Procedures / Treatments   Labs (all labs ordered are listed, but only abnormal results are displayed) Labs Reviewed - No data to display   EKG     RADIOLOGY Right ribs with chest    PROCEDURES:   Procedures   MEDICATIONS ORDERED IN ED: Medications  lidocaine (LIDODERM) 5 % 1 patch (1 patch Transdermal Patch Applied 12/26/22 0954)     IMPRESSION / MDM / [place] / ED COURSE  I  reviewed the triage vital signs and the nursing notes.                              Differential diagnosis includes, but is not limited to, fracture, contusion, strain, pneumothorax  Patient's presentation is most consistent with acute presentation with potential threat to life or bodily function.   X-ray of the right ribs and chest independently reviewed interpreted by me as being negative for rib fracture or pneumothorax, confirmed by radiology  I did explain the findings to the patient.  She does appear to be in a good deal of pain so we will start her on Sterapred to decrease inflammation and Percocet for pain.  She is given incentive spirometer.  Follow-up with your regular doctor if not improving in 2 to 3 days.  Return if worsening.  We also placed a Lidoderm patch on the ribs.  She was instructed to use over-the-counter Lidoderm patch  if this does provide some relief.  She was discharged in stable condition with strict instructions to return if worsening     FINAL CLINICAL IMPRESSION(S) / ED DIAGNOSES   Final diagnoses:  Chest wall pain  Contusion of rib on right side, initial encounter     Rx / DC Orders   ED Discharge Orders          Ordered    predniSONE (STERAPRED UNI-PAK 21 TAB) 10 MG (21) TBPK tablet        12/26/22 0944    oxyCODONE-acetaminophen (PERCOCET) 5-325 MG tablet  Every 4 hours PRN        12/26/22 0944             Note:  This document was prepared using Dragon voice recognition software and may include unintentional dictation errors.    Versie Starks, PA-C 12/26/22 1019    Merlyn Lot, MD 12/26/22 1258

## 2023-07-10 ENCOUNTER — Other Ambulatory Visit: Payer: Self-pay

## 2023-07-10 ENCOUNTER — Emergency Department
Admission: EM | Admit: 2023-07-10 | Discharge: 2023-07-10 | Disposition: A | Discharge status: Home / Self Care | Payer: 59 | Attending: Student in an Organized Health Care Education/Training Program | Admitting: Student in an Organized Health Care Education/Training Program

## 2023-07-10 ENCOUNTER — Emergency Department: Payer: 59

## 2023-07-10 DIAGNOSIS — R112 Nausea with vomiting, unspecified: Secondary | ICD-10-CM | POA: Insufficient documentation

## 2023-07-10 DIAGNOSIS — R109 Unspecified abdominal pain: Secondary | ICD-10-CM | POA: Insufficient documentation

## 2023-07-10 LAB — URINALYSIS, ROUTINE W REFLEX MICROSCOPIC
Bilirubin Urine: NEGATIVE
Glucose, UA: NEGATIVE mg/dL
Hgb urine dipstick: NEGATIVE
Ketones, ur: NEGATIVE mg/dL
Nitrite: NEGATIVE
Protein, ur: NEGATIVE mg/dL
Specific Gravity, Urine: 1.004 — ABNORMAL LOW (ref 1.005–1.030)
pH: 7 (ref 5.0–8.0)

## 2023-07-10 LAB — CBC
HCT: 40.8 % (ref 36.0–46.0)
Hemoglobin: 13.2 g/dL (ref 12.0–15.0)
MCH: 31.9 pg (ref 26.0–34.0)
MCHC: 32.4 g/dL (ref 30.0–36.0)
MCV: 98.6 fL (ref 80.0–100.0)
Platelets: 239 10*3/uL (ref 150–400)
RBC: 4.14 MIL/uL (ref 3.87–5.11)
RDW: 12 % (ref 11.5–15.5)
WBC: 9.5 10*3/uL (ref 4.0–10.5)
nRBC: 0 % (ref 0.0–0.2)

## 2023-07-10 LAB — COMPREHENSIVE METABOLIC PANEL
ALT: 25 U/L (ref 0–44)
AST: 28 U/L (ref 15–41)
Albumin: 3.7 g/dL (ref 3.5–5.0)
Alkaline Phosphatase: 82 U/L (ref 38–126)
Anion gap: 12 (ref 5–15)
BUN: 5 mg/dL — ABNORMAL LOW (ref 6–20)
CO2: 22 mmol/L (ref 22–32)
Calcium: 8.9 mg/dL (ref 8.9–10.3)
Chloride: 104 mmol/L (ref 98–111)
Creatinine, Ser: 0.7 mg/dL (ref 0.44–1.00)
GFR, Estimated: >60 mL/min (ref >60)
Glucose, Bld: 117 mg/dL — ABNORMAL HIGH (ref 70–99)
Potassium: 3.1 mmol/L — ABNORMAL LOW (ref 3.5–5.1)
Sodium: 138 mmol/L (ref 135–145)
Total Bilirubin: 0.6 mg/dL (ref 0.3–1.2)
Total Protein: 7.2 g/dL (ref 6.5–8.1)

## 2023-07-10 LAB — LIPASE, BLOOD: Lipase: 28 U/L (ref 11–51)

## 2023-07-10 MED ORDER — ONDANSETRON 4 MG PO TBDP
4.0000 mg | ORAL_TABLET | Freq: Once | ORAL | Status: AC | PRN
  Administered 2023-07-10: 4 mg via ORAL
  Filled 2023-07-10: qty 1

## 2023-07-10 MED ORDER — ONDANSETRON 4 MG PO TBDP: 4.0000 mg | ORAL_TABLET | Freq: Three times a day (TID) | ORAL | 0 refills | Status: DC | PRN

## 2023-07-10 MED ORDER — OXYCODONE-ACETAMINOPHEN 5-325 MG PO TABS
1.0000 | ORAL_TABLET | ORAL | 0 refills | Status: AC | PRN
Start: 2023-07-10 — End: 2024-07-09

## 2023-07-10 MED ORDER — OXYCODONE-ACETAMINOPHEN 5-325 MG PO TABS
1.0000 | ORAL_TABLET | Freq: Once | ORAL | Status: AC
  Administered 2023-07-10: 1 via ORAL
  Filled 2023-07-10: qty 1

## 2023-07-10 NOTE — ED Notes (Signed)
Patient transported to CT 

## 2023-07-10 NOTE — ED Triage Notes (Addendum)
Pt states she had scans done Wednesday and results found nodule on left lung and kidney stones 3 mm in both kidneys. Pt experiencing vomiting, nausea and pain, cough with brown/green sputum. Was given Tramadol prescription but it hasn't helped.

## 2023-07-10 NOTE — ED Provider Notes (Signed)
[facility] Provider Note    Event Date/Time   First MD Initiated Contact with Patient 07/10/23 1545     (approximate)   History   No chief complaint on file.   HPI  Tonya Miller is a 57 y.o. female presents to the ER for evaluation of right flank pain radiating to the right groin associated with nausea vomiting.  States she had outpatient x-rays of her spine for chronic back pain and was told that she had multiple kidney stones.  She denies any hematuria no dysuria no fevers.  Was given Zofran in triage with improvement in symptoms.  Still has some mild flank pain.     Physical Exam   Triage Vital Signs: ED Triage Vitals  Encounter Vitals Group     BP 07/10/23 1357 115/87     Systolic BP Percentile --      Diastolic BP Percentile --      Pulse Rate 07/10/23 1357 88     Resp 07/10/23 1357 16     Temp 07/10/23 1357 98.5 F (36.9 C)     Temp Source 07/10/23 1357 Oral     SpO2 07/10/23 1357 93 %     Weight --      Height --      Head Circumference --      Peak Flow --      Pain Score 07/10/23 1356 10     Pain Loc --      Pain Education --      Exclude from Growth Chart --     Most recent vital signs: Vitals:   07/10/23 1357 07/10/23 1610  BP: 115/87 106/75  Pulse: 88 64  Resp: 16   Temp: 98.5 F (36.9 C)   SpO2: 93% 93%     Constitutional: Alert  Eyes: Conjunctivae are normal.  Head: Atraumatic. Nose: No congestion/rhinnorhea. Mouth/Throat: Mucous membranes are moist.   Neck: Painless ROM.  Cardiovascular:   Good peripheral circulation. Respiratory: Normal respiratory effort.  No retractions.  Gastrointestinal: Soft and nontender in all 4 quadrants. No guarding or rebound Musculoskeletal:  no deformity Neurologic:  MAE spontaneously. No gross focal neurologic deficits are appreciated.  Skin:  Skin is warm, dry and intact. No rash noted. Psychiatric: Mood and affect are normal. Speech and behavior are normal.    ED Results  / Procedures / Treatments   Labs (all labs ordered are listed, but only abnormal results are displayed) Labs Reviewed  COMPREHENSIVE METABOLIC PANEL - Abnormal; Notable for the following components:      Result Value   Potassium 3.1 (*)    Glucose, Bld 117 (*)    BUN 5 (*)    All other components within normal limits  URINALYSIS, ROUTINE W REFLEX MICROSCOPIC - Abnormal; Notable for the following components:   Color, Urine YELLOW (*)    APPearance CLEAR (*)    Specific Gravity, Urine 1.004 (*)    Leukocytes,Ua TRACE (*)    Bacteria, UA RARE (*)    All other components within normal limits  LIPASE, BLOOD  CBC     EKG     RADIOLOGY Please see ED Course for my review and interpretation.  I personally reviewed all radiographic images ordered to evaluate for the above acute complaints and reviewed radiology reports and findings.  These findings were personally discussed with the patient.  Please see medical record for radiology report.    PROCEDURES:  Critical Care performed: No  Procedures  MEDICATIONS ORDERED IN ED: Medications  ondansetron (ZOFRAN-ODT) disintegrating tablet 4 mg (4 mg Oral Given 07/10/23 1403)  oxyCODONE-acetaminophen (PERCOCET/ROXICET) 5-325 MG per tablet 1 tablet (1 tablet Oral Given 07/10/23 1606)     IMPRESSION / MDM / ASSESSMENT AND PLAN / ED COURSE  I reviewed the triage vital signs and the nursing notes.                              Differential diagnosis includes, but is not limited to, stone, MSK strain, pyelonephritis, colitis, appendicitis, mass  Patient presenting to the ER for evaluation of symptoms as described above.  Based on symptoms, risk factors and considered above differential, this presenting complaint could reflect a potentially life-threatening illness therefore the patient will be placed on continuous pulse oximetry and telemetry for monitoring.  Laboratory evaluation will be sent to evaluate for the above complaints.   Blood work is reassuring with mild hypokalemia.  Patient declining IV medication at this time.  Will order CT imaging to further evaluate for the but differential.    Clinical Course as of 07/10/23 1839  Mon Jul 10, 2023  1655 CT imaging on my review and interpretation without evidence of ureteral stone or hydro  will await formal radiology report. [PR]  1838 CT imaging without acute findings.  Patient reassessed remains well-appearing in no acute distress.  Does appear appropriate for outpatient follow-up. [PR]    Clinical Course User Index [PR] Willy Eddy, MD     FINAL CLINICAL IMPRESSION(S) / ED DIAGNOSES   Final diagnoses:  Flank pain     Rx / DC Orders   ED Discharge Orders          Ordered    ondansetron (ZOFRAN-ODT) 4 MG disintegrating tablet  Every 8 hours PRN        07/10/23 1838    oxyCODONE-acetaminophen (PERCOCET) 5-325 MG tablet  Every 4 hours PRN        07/10/23 1838             Note:  This document was prepared using Dragon voice recognition software and may include unintentional dictation errors.    Willy Eddy, MD 07/10/23 [phone]

## 2023-07-14 NOTE — Progress Notes (Unsigned)
Referring Physician:  Alm Bustard, NP [address] [place],  [place] [address]  Primary Physician:  Marguarite Arbour, MD  History of Present Illness: 07/14/2023*** Tonya Miller has a history of chronic back pain, post-Covid syndrome, prediabetes, NSTEMI, CAD, COPD, emphysema, hyperlipidemia,   History of back surgery x 2, in 2005 and 2009. Had L1 kyphoplasty as well.   Seen in ED on 07/10/23 for flank pain and was given zofran and percocet. CT renal study showed nonobstructive left nephrolithiasis.   Look at xrays from [facility]?***     She is taking PLAVIX.   Duration: *** Location: *** Quality: *** Severity: ***  Precipitating: aggravated by *** Modifying factors: made better by *** Weakness: none Timing: *** Bowel/Bladder Dysfunction: none  Conservative measures:  Physical therapy: ***  Multimodal medical therapy including regular antiinflammatories: ***  Injections: *** epidural steroid injections  Past Surgery:  History of back surgery x 2, in 2005 and 2009. Had L1 kyphoplasty as well.   Tonya Miller has ***no symptoms of cervical myelopathy.  The symptoms are causing a significant impact on the patient's life.   Review of Systems:  A 10 point review of systems is negative, except for the pertinent positives and negatives detailed in the HPI.  Past Medical History: Past Medical History:  Diagnosis Date   Anxiety    COPD (chronic obstructive pulmonary disease) (HCC)    Muscle spasm     Past Surgical History: Past Surgical History:  Procedure Laterality Date   BACK SURGERY     kyphoplasty   BACK SURGERY     CESAREAN SECTION     CORONARY STENT INTERVENTION N/A 07/28/2021   Procedure: CORONARY STENT INTERVENTION;  Surgeon: [name], MD;  Location: [facility] INVASIVE CV LAB;  Service: Cardiovascular;  Laterality: N/A;   LEFT HEART CATH AND CORONARY ANGIOGRAPHY N/A 07/28/2021   Procedure: LEFT HEART CATH AND CORONARY ANGIOGRAPHY;  Surgeon:  [name], MD;  Location: [facility] INVASIVE CV LAB;  Service: Cardiovascular;  Laterality: N/A;    Allergies: Allergies as of 07/20/2023   (No Known Allergies)    Medications: Outpatient Encounter Medications as of 07/20/2023  Medication Sig   acetaminophen (TYLENOL) 500 MG tablet Take 1,000 mg by mouth every 6 (six) hours as needed for moderate pain.   albuterol (VENTOLIN HFA) 108 (90 Base) MCG/ACT inhaler Inhale 2 puffs into the lungs every 6 (six) hours as needed for wheezing or shortness of breath.   aspirin EC 81 MG EC tablet Take 1 tablet (81 mg total) by mouth daily. Swallow whole.   atorvastatin (LIPITOR) 80 MG tablet Take 1 tablet (80 mg total) by mouth daily.   busPIRone (BUSPAR) 10 MG tablet Take 10 mg by mouth 2 (two) times daily as needed.   clopidogrel (PLAVIX) 75 MG tablet Take 1 tablet (75 mg total) by mouth daily.   isosorbide mononitrate (IMDUR) 30 MG 24 hr tablet Take 1 tablet (30 mg total) by mouth daily.   nicotine (NICODERM CQ - DOSED IN MG/24 HOURS) 21 mg/24hr patch Place 1 patch (21 mg total) onto the skin daily. (Patient not taking: Reported on 01/29/2022)   nitroGLYCERIN (NITROSTAT) 0.4 MG SL tablet Place 1 tablet (0.4 mg total) under the tongue every 5 (five) minutes as needed for chest pain.   ondansetron (ZOFRAN-ODT) 4 MG disintegrating tablet Take 1 tablet (4 mg total) by mouth every 8 (eight) hours as needed for nausea or vomiting.   oxyCODONE-acetaminophen (PERCOCET) 5-325 MG tablet Take  1 tablet by mouth every 4 (four) hours as needed for severe pain.   predniSONE (STERAPRED UNI-PAK 21 TAB) 10 MG (21) TBPK tablet Take 6 pills on day one then decrease by 1 pill each day   ranolazine (RANEXA) 500 MG 12 hr tablet Take 1 tablet (500 mg total) by mouth 2 (two) times daily. (Patient not taking: Reported on 01/29/2022)   Vitamin D, Ergocalciferol, (DRISDOL) 1.25 MG (50000 UNIT) CAPS capsule Take 50,000 Units by mouth once a week. (Patient not taking: Reported on  01/29/2022)   No facility-administered encounter medications on file as of 07/20/2023.    Social History: Social History   Tobacco Use   Smoking status: Every Day    Current packs/day: 0.50    Types: Cigarettes   Smokeless tobacco: Never  Vaping Use   Vaping status: Some Days  Substance Use Topics   Alcohol use: No   Drug use: Yes    Types: Marijuana    Comment: occasionally for nausea and anxiety    Family Medical History: Family History  Problem Relation Age of Onset   Heart failure Mother    Heart disease Brother     Physical Examination: There were no vitals filed for this visit.  General: Patient is well developed, well nourished, calm, collected, and in no apparent distress. Attention to examination is appropriate.  Respiratory: Patient is breathing without any difficulty.   NEUROLOGICAL:     Awake, alert, oriented to person, place, and time.  Speech is clear and fluent. Fund of knowledge is appropriate.   Cranial Nerves: Pupils equal round and reactive to light.  Facial tone is symmetric.    *** ROM of cervical spine *** pain *** posterior cervical tenderness. *** tenderness in bilateral trapezial region.   *** ROM of lumbar spine *** pain *** posterior lumbar tenderness.   No abnormal lesions on exposed skin.   Strength: Side Biceps Triceps Deltoid Interossei Grip Wrist Ext. Wrist Flex.  R 5 5 5 5 5 5 5   L 5 5 5 5 5 5 5    Side Iliopsoas Quads Hamstring PF DF EHL  R 5 5 5 5 5 5   L 5 5 5 5 5 5    Reflexes are ***2+ and symmetric at the biceps, brachioradialis, patella and achilles.   Hoffman's is absent.  Clonus is not present.   Bilateral upper and lower extremity sensation is intact to light touch.     Gait is normal.   ***No difficulty with tandem gait.    Medical Decision Making  Imaging: Cervical, thoracic, and lumbar xrays dated 07/04/23:  Cervical spondylosis/DDD, worse at C5-C6. L1 kyphoplasty. Mild compression of L2 age indeterminate.  Retrolisthesis L2-L3 and L3-L4. Lumbar DDD and spondylosis.   Report for above xrays ***.  Assessment and Plan: Tonya Miller is a pleasant 57 y.o. female has ***  Treatment options discussed with patient and following plan made:   - Order for physical therapy for *** spine ***. Patient to call to schedule appointment. *** - Continue current medications including ***. Reviewed dosing and side effects.  - Prescription for ***. Reviewed dosing and side effects. Take with food.  - Prescription for *** to take prn muscle spasms. Reviewed dosing and side effects. Discussed this can cause drowsiness.  - MRI of *** to further evaluate *** radiculopathy. No improvement time or medications (***).  - Referral to PMR at [facility] to discuss possible *** injections.  - Will schedule phone visit to review MRI results once  I get them back.   I spent a total of *** minutes in face-to-face and non-face-to-face activities related to this patient's care today including review of outside records, review of imaging, review of symptoms, physical exam, discussion of differential diagnosis, discussion of treatment options, and documentation.   Thank you for involving me in the care of this patient.   Drake Leach PA-C Dept. of Neurosurgery

## 2023-07-17 ENCOUNTER — Inpatient Hospital Stay
Admission: RE | Admit: 2023-07-17 | Discharge: 2023-07-17 | Disposition: A | Discharge status: Home / Self Care | Payer: Self-pay | Source: Ambulatory Visit | Attending: Orthopedic Surgery | Admitting: Orthopedic Surgery

## 2023-07-17 ENCOUNTER — Other Ambulatory Visit: Payer: Self-pay | Admitting: Family Medicine

## 2023-07-17 DIAGNOSIS — Z049 Encounter for examination and observation for unspecified reason: Secondary | ICD-10-CM

## 2023-07-20 ENCOUNTER — Ambulatory Visit: Payer: 59 | Admitting: Orthopedic Surgery

## 2023-07-20 ENCOUNTER — Encounter: Payer: Self-pay | Admitting: Orthopedic Surgery

## 2023-07-20 VITALS — BP 115/75 | Ht 61.0 in | Wt 109.4 lb

## 2023-07-20 DIAGNOSIS — M47812 Spondylosis without myelopathy or radiculopathy, cervical region: Secondary | ICD-10-CM

## 2023-07-20 DIAGNOSIS — M4722 Other spondylosis with radiculopathy, cervical region: Secondary | ICD-10-CM

## 2023-07-20 DIAGNOSIS — M5416 Radiculopathy, lumbar region: Secondary | ICD-10-CM

## 2023-07-20 DIAGNOSIS — Z8781 Personal history of (healed) traumatic fracture: Secondary | ICD-10-CM

## 2023-07-20 DIAGNOSIS — M4726 Other spondylosis with radiculopathy, lumbar region: Secondary | ICD-10-CM | POA: Diagnosis not present

## 2023-07-20 DIAGNOSIS — M50322 Other cervical disc degeneration at C5-C6 level: Secondary | ICD-10-CM | POA: Diagnosis not present

## 2023-07-20 DIAGNOSIS — M51362 Other intervertebral disc degeneration, lumbar region with discogenic back pain and lower extremity pain: Secondary | ICD-10-CM | POA: Diagnosis not present

## 2023-07-20 DIAGNOSIS — M47816 Spondylosis without myelopathy or radiculopathy, lumbar region: Secondary | ICD-10-CM

## 2023-07-20 DIAGNOSIS — M5030 Other cervical disc degeneration, unspecified cervical region: Secondary | ICD-10-CM

## 2023-07-20 DIAGNOSIS — M5412 Radiculopathy, cervical region: Secondary | ICD-10-CM

## 2023-07-20 DIAGNOSIS — M4316 Spondylolisthesis, lumbar region: Secondary | ICD-10-CM

## 2023-07-20 NOTE — Patient Instructions (Signed)
It was so nice to see you today. Thank you so much for coming in.    You have some wear and tear (arthritis) in your neck and your back.   I want to get an MRI of your neck and lower back to look into things further. We will get this approved through your insurance and [facility] will call you to schedule the appointment.   When you get the MRIs, I also want you to get some additional xrays of your lower back.   [facility] (building with the white pillars) is located off of [place]. The address is [address] [place], [place] [address].   After you have the MRI scans and xrays, it takes 10-14 days for me to get the results back. Once I have them, we will call you to schedule a follow up phone visit with me to review them.   Please do not hesitate to call if you have any questions or concerns. You can also message me in MyChart.   If you have not heard back about any of the above things in the next week, please call the office so we can help you get them scheduled.   Happy early birthday!  Drake Leach PA-C [phone]

## 2023-07-26 ENCOUNTER — Other Ambulatory Visit: Payer: Self-pay

## 2023-07-26 DIAGNOSIS — Z8781 Personal history of (healed) traumatic fracture: Secondary | ICD-10-CM

## 2023-07-26 DIAGNOSIS — M5416 Radiculopathy, lumbar region: Secondary | ICD-10-CM

## 2023-07-26 DIAGNOSIS — M5412 Radiculopathy, cervical region: Secondary | ICD-10-CM

## 2023-07-26 DIAGNOSIS — M5030 Other cervical disc degeneration, unspecified cervical region: Secondary | ICD-10-CM

## 2023-07-26 DIAGNOSIS — M47812 Spondylosis without myelopathy or radiculopathy, cervical region: Secondary | ICD-10-CM

## 2023-07-26 DIAGNOSIS — M47816 Spondylosis without myelopathy or radiculopathy, lumbar region: Secondary | ICD-10-CM

## 2023-08-08 ENCOUNTER — Telehealth: Payer: Self-pay | Admitting: Orthopedic Surgery

## 2023-08-08 DIAGNOSIS — Z8781 Personal history of (healed) traumatic fracture: Secondary | ICD-10-CM

## 2023-08-08 DIAGNOSIS — M5030 Other cervical disc degeneration, unspecified cervical region: Secondary | ICD-10-CM

## 2023-08-08 DIAGNOSIS — M47816 Spondylosis without myelopathy or radiculopathy, lumbar region: Secondary | ICD-10-CM

## 2023-08-08 DIAGNOSIS — M47812 Spondylosis without myelopathy or radiculopathy, cervical region: Secondary | ICD-10-CM

## 2023-08-08 DIAGNOSIS — M5416 Radiculopathy, lumbar region: Secondary | ICD-10-CM

## 2023-08-08 DIAGNOSIS — M5412 Radiculopathy, cervical region: Secondary | ICD-10-CM

## 2023-08-08 NOTE — Telephone Encounter (Signed)
I don't have any other ideas. If insurance said she needs to do PT prior to MRI approval then my hands are tied. She can try calling her insurance but I'm not sure that would help.   Some PT places are open early and later if that would help as well.

## 2023-08-08 NOTE — Telephone Encounter (Signed)
LMOM for patient to return call to schedule a 6-8 week follow up.

## 2023-08-08 NOTE — Addendum Note (Signed)
Addended byDrake Leach on: 08/08/2023 03:55 PM   Modules accepted: Orders

## 2023-08-08 NOTE — Telephone Encounter (Signed)
Patient has called stating her MRI was denied and she was wondering what else she could do. I informed her the denial was due to her not having any physical therapy. Patient states it is hard for her to go to physical therapy since she works 6 days a week but her back is in pain and she is wondering what to do, please advise

## 2023-08-08 NOTE — Telephone Encounter (Signed)
LMOM for patient to return call.  

## 2023-08-08 NOTE — Telephone Encounter (Signed)
Patient called back, Lyla Son was not available. I read her your message. I told the patient that [facility] offers late appts. She agreed for referral to be sent there.

## 2023-08-08 NOTE — Telephone Encounter (Signed)
PT orders done to [facility]. Please make her a f/u with me in 6-8 weeks.

## 2023-10-03 NOTE — Progress Notes (Deleted)
Referring Physician:  Marguarite Arbour, MD [address] [facility] [place],  [place] [address]  Primary Physician:  Marguarite Arbour, MD  History of Present Illness: 10/03/2023 Ms. Ja Carpentier has a history of chronic back pain, post-Covid syndrome, prediabetes, NSTEMI, CAD, COPD, emphysema, hyperlipidemia,   History of L1 kyphoplasty as well.   Last seen by me on 07/20/23 for constant LBP with bilateral leg pain. She has known mild compression of L2 age indeterminate, retrolisthesis L2-L3 and L3-L4, along with lumbar DDD and spondylosis.    She also had constant neck pain with constant left arm pain to her hand. She has known cervical spondylosis/DDD, worse at C5-C6.   MRI of cervical and lumbar spine ordered along with flex/ext lumbar xrays. MRIs denied as she did not do any PT. Xrays not done.   She is here for follow up.         Seen in ED on 07/10/23 for flank pain and was given zofran and percocet. CT renal study showed nonobstructive left nephrolithiasis.   6 month history of constant LBP with bilateral posterior leg pain to her feet. No specific alleviating factors. She has weakness in both legs- they feel heavy. Pain is worse with stairs- legs feel like rubber, this happens with walking but is not as bad. She has numbness and tingling in both legs.   She also has a 3 months history constant neck pain with constant left arm pain to her hand. She has intermittent right arm pain to her hand as well. She has constant numbness/tingling in left arm- feels "asleep." Some relief with heating pad and rest. She notes weakness in her hands. Is not dropping things.   She is taking PLAVIX.   Bowel/Bladder Dysfunction: none  Conservative measures:  Physical therapy: none Multimodal medical therapy including regular antiinflammatories: tylenol  Injections: No epidural steroid injections  Past Surgery:  Had L1 kyphoplasty as well.   Cleophas Dunker has no symptoms  of cervical myelopathy.  The symptoms are causing a significant impact on the patient's life.   Review of Systems:  A 10 point review of systems is negative, except for the pertinent positives and negatives detailed in the HPI.  Past Medical History: Past Medical History:  Diagnosis Date   Anxiety    COPD (chronic obstructive pulmonary disease) (HCC)    Muscle spasm     Past Surgical History: Past Surgical History:  Procedure Laterality Date   BACK SURGERY     kyphoplasty   BACK SURGERY     CESAREAN SECTION     CORONARY STENT INTERVENTION N/A 07/28/2021   Procedure: CORONARY STENT INTERVENTION;  Surgeon: [name], MD;  Location: [facility] INVASIVE CV LAB;  Service: Cardiovascular;  Laterality: N/A;   LEFT HEART CATH AND CORONARY ANGIOGRAPHY N/A 07/28/2021   Procedure: LEFT HEART CATH AND CORONARY ANGIOGRAPHY;  Surgeon: [name], MD;  Location: [facility] INVASIVE CV LAB;  Service: Cardiovascular;  Laterality: N/A;    Allergies: Allergies as of 10/04/2023   (No Known Allergies)    Medications: Outpatient Encounter Medications as of 10/04/2023  Medication Sig   acetaminophen (TYLENOL) 500 MG tablet Take 1,000 mg by mouth every 6 (six) hours as needed for moderate pain.   albuterol (VENTOLIN HFA) 108 (90 Base) MCG/ACT inhaler Inhale 2 puffs into the lungs every 6 (six) hours as needed for wheezing or shortness of breath.   atorvastatin (LIPITOR) 80 MG tablet Take 1 tablet (80 mg total) by mouth  daily.   busPIRone (BUSPAR) 10 MG tablet Take 10 mg by mouth 2 (two) times daily as needed.   clopidogrel (PLAVIX) 75 MG tablet Take 1 tablet (75 mg total) by mouth daily.   isosorbide mononitrate (IMDUR) 30 MG 24 hr tablet Take 1 tablet (30 mg total) by mouth daily.   nicotine (NICODERM CQ - DOSED IN MG/24 HOURS) 21 mg/24hr patch Place 1 patch (21 mg total) onto the skin daily.   nitroGLYCERIN (NITROSTAT) 0.4 MG SL tablet Place 1 tablet (0.4 mg total) under the tongue every 5 (five)  minutes as needed for chest pain.   ondansetron (ZOFRAN-ODT) 4 MG disintegrating tablet Take 1 tablet (4 mg total) by mouth every 8 (eight) hours as needed for nausea or vomiting.   Vitamin D, Ergocalciferol, (DRISDOL) 1.25 MG (50000 UNIT) CAPS capsule Take 50,000 Units by mouth once a week.   No facility-administered encounter medications on file as of 10/04/2023.    Social History: Social History   Tobacco Use   Smoking status: Every Day    Current packs/day: 0.50    Types: Cigarettes   Smokeless tobacco: Never  Vaping Use   Vaping status: Some Days  Substance Use Topics   Alcohol use: No   Drug use: Yes    Types: Marijuana    Comment: occasionally for nausea and anxiety    Family Medical History: Family History  Problem Relation Age of Onset   Heart failure Mother    Heart disease Brother     Physical Examination: There were no vitals filed for this visit.    Awake, alert, oriented to person, place, and time.  Speech is clear and fluent. Fund of knowledge is appropriate.   Cranial Nerves: Pupils equal round and reactive to light.  Facial tone is symmetric.    No posterior cervical tenderness. Mild tenderness in bilateral trapezial region.   No posterior lumbar tenderness.   No abnormal lesions on exposed skin.   Strength: Side Biceps Triceps Deltoid Interossei Grip Wrist Ext. Wrist Flex.  R 5 5 5 5 5 5 5   L 5 5 5 5 5 5 5    Side Iliopsoas Quads Hamstring PF DF EHL  R 5 5 5 5 5 5   L 5 5 5 5 5 5    Reflexes are 2+ and symmetric at the biceps, brachioradialis, patella and achilles.   Hoffman's is absent.  Clonus is not present.   Bilateral upper and lower extremity sensation is intact to light touch.     Good ROM of both shoulders with no pain.   Gait is normal.     Medical Decision Making  Imaging: None  Assessment and Plan: Ms. Edgington is a pleasant 57 y.o. female who has a history of L1 kyphoplasty.   6 month history of constant LBP with bilateral  posterior leg pain to her feet. Pain is worse with stairs- legs feel like rubber, this happens with walking but is not as bad.   She has known mild compression of L2 age indeterminate, retrolisthesis L2-L3 and L3-L4, along with lumbar DDD and spondylosis.   She also has a 3 months history constant neck pain with constant left arm pain to her hand. She has intermittent right arm pain to her hand as well. She has constant numbness/tingling in left arm- feels "asleep." She notes weakness in her hands. I  She has known cervical spondylosis/DDD, worse at C5-C6.   Treatment options discussed with patient and following plan made:   -  MRI of cervical and lumbar spine to further evaluate cervical and lumbar radiculopathy. No improvement with time or medications (tylenol).  - Will also get lumbar xrays with flexion/extension to look for any instability at L1-L2.  - Medications limited as she is on PLAVIX.  - Depending on results of imaging, may consider PT and/or injections.  - Will schedule phone visit to review MRI/xray results once I get them back.   I spent a total of 30 minutes in face-to-face and non-face-to-face activities related to this patient's care today including review of outside records, review of imaging, review of symptoms, physical exam, discussion of differential diagnosis, discussion of treatment options, and documentation.   Thank you for involving me in the care of this patient.   Drake Leach PA-C Dept. of Neurosurgery

## 2023-10-04 ENCOUNTER — Ambulatory Visit: Payer: 59 | Admitting: Orthopedic Surgery

## 2023-10-06 ENCOUNTER — Other Ambulatory Visit: Payer: Self-pay

## 2023-10-06 ENCOUNTER — Emergency Department: Payer: 59

## 2023-10-06 ENCOUNTER — Emergency Department
Admission: EM | Admit: 2023-10-06 | Discharge: 2023-10-06 | Disposition: A | Discharge status: Home / Self Care | Payer: 59 | Attending: Emergency Medicine | Admitting: Emergency Medicine

## 2023-10-06 DIAGNOSIS — N200 Calculus of kidney: Secondary | ICD-10-CM | POA: Diagnosis not present

## 2023-10-06 DIAGNOSIS — R3129 Other microscopic hematuria: Secondary | ICD-10-CM | POA: Insufficient documentation

## 2023-10-06 DIAGNOSIS — J449 Chronic obstructive pulmonary disease, unspecified: Secondary | ICD-10-CM | POA: Insufficient documentation

## 2023-10-06 DIAGNOSIS — R109 Unspecified abdominal pain: Secondary | ICD-10-CM | POA: Diagnosis present

## 2023-10-06 LAB — URINALYSIS, ROUTINE W REFLEX MICROSCOPIC
Bacteria, UA: NONE SEEN
Bilirubin Urine: NEGATIVE
Glucose, UA: NEGATIVE mg/dL
Ketones, ur: NEGATIVE mg/dL
Leukocytes,Ua: NEGATIVE
Nitrite: NEGATIVE
Protein, ur: NEGATIVE mg/dL
Specific Gravity, Urine: 1.003 — ABNORMAL LOW (ref 1.005–1.030)
pH: 6 (ref 5.0–8.0)

## 2023-10-06 LAB — COMPREHENSIVE METABOLIC PANEL
ALT: 14 U/L (ref 0–44)
AST: 18 U/L (ref 15–41)
Albumin: 4 g/dL (ref 3.5–5.0)
Alkaline Phosphatase: 76 U/L (ref 38–126)
Anion gap: 10 (ref 5–15)
BUN: 12 mg/dL (ref 6–20)
CO2: 24 mmol/L (ref 22–32)
Calcium: 9.2 mg/dL (ref 8.9–10.3)
Chloride: 104 mmol/L (ref 98–111)
Creatinine, Ser: 1.01 mg/dL — ABNORMAL HIGH (ref 0.44–1.00)
GFR, Estimated: >60 mL/min (ref >60)
Glucose, Bld: 115 mg/dL — ABNORMAL HIGH (ref 70–99)
Potassium: 3.8 mmol/L (ref 3.5–5.1)
Sodium: 138 mmol/L (ref 135–145)
Total Bilirubin: 1 mg/dL (ref <1.2)
Total Protein: 7.3 g/dL (ref 6.5–8.1)

## 2023-10-06 LAB — CBC
HCT: 43.2 % (ref 36.0–46.0)
Hemoglobin: 14.3 g/dL (ref 12.0–15.0)
MCH: 32.4 pg (ref 26.0–34.0)
MCHC: 33.1 g/dL (ref 30.0–36.0)
MCV: 98 fL (ref 80.0–100.0)
Platelets: 205 10*3/uL (ref 150–400)
RBC: 4.41 MIL/uL (ref 3.87–5.11)
RDW: 11.9 % (ref 11.5–15.5)
WBC: 9.2 10*3/uL (ref 4.0–10.5)
nRBC: 0 % (ref 0.0–0.2)

## 2023-10-06 LAB — LIPASE, BLOOD: Lipase: 28 U/L (ref 11–51)

## 2023-10-06 MED ORDER — ONDANSETRON 4 MG PO TBDP
4.0000 mg | ORAL_TABLET | Freq: Once | ORAL | Status: AC
  Administered 2023-10-06: 4 mg via ORAL
  Filled 2023-10-06: qty 1

## 2023-10-06 MED ORDER — ONDANSETRON 4 MG PO TBDP: 4.0000 mg | ORAL_TABLET | Freq: Four times a day (QID) | ORAL | 0 refills | Status: DC | PRN

## 2023-10-06 MED ORDER — IBUPROFEN 600 MG PO TABS: 600.0000 mg | ORAL_TABLET | Freq: Once | ORAL | Status: DC

## 2023-10-06 MED ORDER — ACETAMINOPHEN 325 MG PO TABS: 650.0000 mg | ORAL_TABLET | Freq: Once | ORAL | Status: DC

## 2023-10-06 NOTE — ED Provider Notes (Signed)
[facility] Provider Note    Event Date/Time   First MD Initiated Contact with Patient 10/06/23 [phone]     (approximate)   History   Abdominal Pain and Back Pain   HPI  Tonya Miller is a 57 y.o. female past medical history of COPD cardiac disease, subarachnoid hemorrhage  Reports that she has been having an achy discomfort in her flank and back since Wednesday.  Also accompanied by dark color of the urine intermittently sometimes is clear sometimes it seems little dark or tea colored  She had some nausea and occasional vomiting.  She reports had pain that was sharp but that is better now.  Now just has a sort of mild nauseated feeling.  Does not wish for any pain medication, but would like to try a "Zofran"  No fevers or chills.  No diarrhea.  She has had a history of kidney stones feels very similar  No vaginal bleeding or discharge.  Reports been through menopause     Physical Exam   Triage Vital Signs: ED Triage Vitals  Encounter Vitals Group     BP 10/06/23 0817 122/89     Systolic BP Percentile --      Diastolic BP Percentile --      Pulse Rate 10/06/23 0817 72     Resp 10/06/23 0817 17     Temp 10/06/23 0817 97.7 F (36.5 C)     Temp Source 10/06/23 0817 Oral     SpO2 10/06/23 0817 95 %     Weight 10/06/23 0818 110 lb (49.9 kg)     Height 10/06/23 0818 5\' 1"  (1.549 m)     Head Circumference --      Peak Flow --      Pain Score 10/06/23 0822 5     Pain Loc --      Pain Education --      Exclude from Growth Chart --     Most recent vital signs: Vitals:   10/06/23 0817  BP: 122/89  Pulse: 72  Resp: 17  Temp: 97.7 F (36.5 C)  SpO2: 95%     General: Awake, no distress.  Very pleasant.  Accompanied by her son CV:  Good peripheral perfusion.  Normal tones Resp:  Normal effort.  Clear bilateral Abd:  No distention.  Soft nontender nondistended but she reports some mild nauseated feeling to palpation.  Reports some tenderness  to percussion along the left left costovertebral angle Other:  Warm well-perfused lower extremities bilateral   ED Results / Procedures / Treatments   Labs (all labs ordered are listed, but only abnormal results are displayed) Labs Reviewed  COMPREHENSIVE METABOLIC PANEL - Abnormal; Notable for the following components:      Result Value   Glucose, Bld 115 (*)    Creatinine, Ser 1.01 (*)    All other components within normal limits  URINALYSIS, ROUTINE W REFLEX MICROSCOPIC - Abnormal; Notable for the following components:   Color, Urine STRAW (*)    APPearance CLEAR (*)    Specific Gravity, Urine 1.003 (*)    Hgb urine dipstick SMALL (*)    All other components within normal limits  LIPASE, BLOOD  CBC     EKG     RADIOLOGY  CT Renal Stone Study Result Date: 10/06/2023 CLINICAL DATA:  Abdominal/flank pain, stone suspected. Low abdominal pain radiating into the back stent yesterday. Intermittent urine discoloration with nausea and vomiting. History of kidney stones. EXAM: CT  ABDOMEN AND PELVIS WITHOUT CONTRAST TECHNIQUE: Multidetector CT imaging of the abdomen and pelvis was performed following the standard protocol without IV contrast. RADIATION DOSE REDUCTION: This exam was performed according to the departmental dose-optimization program which includes automated exposure control, adjustment of the mA and/or kV according to patient size and/or use of iterative reconstruction technique. COMPARISON:  Abdominopelvic CT 07/10/2023 FINDINGS: Lower chest: Emphysematous changes at both lung bases. No airspace disease or significant pleural effusion. Aortic atherosclerosis noted. Hepatobiliary: The liver appears unremarkable as imaged in the noncontrast state. No focal abnormalities are identified. No evidence of gallstones, gallbladder wall thickening or biliary dilatation. Pancreas: Unremarkable. No pancreatic ductal dilatation or surrounding inflammatory changes. Spleen: Normal in size  without focal abnormality. Adrenals/Urinary Tract: Both adrenal glands appear normal. Multiple nonobstructing left renal calculi appear unchanged, largest in the upper interpolar region, measuring 5 mm on coronal image 54/5. No evidence of ureteral calculus, hydronephrosis or perinephric soft tissue stranding. The bladder appears unremarkable for its degree of distention. Stomach/Bowel: No enteric contrast administered. The stomach appears unremarkable for its degree of distension. No evidence of bowel wall thickening, distention or surrounding inflammatory change. The appendix is not clearly visualized, although there is no pericecal inflammation to suggest acute appendicitis. There is prominent stool throughout the colon. Vascular/Lymphatic: There are no enlarged abdominal or pelvic lymph nodes. Age advanced aortic and branch vessel atherosclerosis without evidence of aneurysm. Reproductive: The uterus and ovaries appear unremarkable. No evidence of adnexal mass. Other: No evidence of abdominal wall mass or hernia. No ascites or pneumoperitoneum. Musculoskeletal: No acute or significant osseous findings. Stable chronic compression deformities at L1 and L2 status post L1 spinal augmentation. IMPRESSION: 1. No acute findings or explanation for the patient's symptoms. 2. Stable nonobstructing left renal calculi. No evidence of ureteral calculus, hydronephrosis or perinephric soft tissue stranding. 3. Prominent stool throughout the colon suggesting constipation. 4. Aortic Atherosclerosis (ICD10-I70.0) and Emphysema (ICD10-J43.9). Electronically Signed   By: Carey Bullocks M.D.   On: 10/06/2023 10:36     CT imaging reviewed by me.  Reassuring findings.  No acute intra-abdominal findings.   PROCEDURES:  Critical Care performed: No  Procedures   MEDICATIONS ORDERED IN ED: Medications  ondansetron (ZOFRAN-ODT) disintegrating tablet 4 mg (4 mg Oral Given 10/06/23 1003)     IMPRESSION / MDM / ASSESSMENT  AND PLAN / ED COURSE  I reviewed the triage vital signs and the nursing notes.                              Differential diagnosis includes, but is not limited to, question symptomatic nephrolithiasis, renal cyst, hemorrhage, hematuria, UTI, pyelonephritis, etc.  Symptoms seem to be likely urinary in nature with report of intermittent dark coloration of the urine.  Urinalysis with hemoglobin positivity, but no evidence of infection.  She has no fever her white count is normal  Labs demonstrate very mild increase in her creatinine, GFR is normal.  CBC normal.  LFTs with normal  Reassuring clinical exam no acute vascular symptoms.  Given her clinical history previous history of nephro lithiasis, will order CT renal study.  Zofran  Patient's presentation is most consistent with acute complicated illness / injury requiring diagnostic workup.   ----------------------------------------- 11:41 AM on 10/06/2023 ----------------------------------------- Patient resting comfortably advises Zofran has helped her nausea has gone away.  She is currently asymptomatic resting comfortably.  Reassuring exam fully awake and alert.  Low probability or concern for  an acute vascular condition finding and I do not think adding contrasted study would be helpful at this time.  She does have very small amount of hematuria.  Discussed with patient we will have her follow-up with urology, also with her primary Dr. Judithann Sheen.  Interim careful return precautions including she is develop severe pain fever, vomiting, foul-smelling urine, etc. discussed with the patient is agreeable  Return precautions and treatment recommendations and follow-up discussed with the patient who is agreeable with the plan.        FINAL CLINICAL IMPRESSION(S) / ED DIAGNOSES   Final diagnoses:  Kidney stone on left side  Other microscopic hematuria     Rx / DC Orders   ED Discharge Orders          Ordered    ondansetron (ZOFRAN-ODT)  4 MG disintegrating tablet  Every 6 hours PRN        10/06/23 1140             Note:  This document was prepared using Dragon voice recognition software and may include unintentional dictation errors.   Sharyn Creamer, MD 10/06/23 [phone]

## 2023-10-06 NOTE — ED Notes (Signed)
Patient taken to CT scan.

## 2023-10-06 NOTE — ED Notes (Signed)
Patient is sitting upright on the stretcher. Patient had asked for a drink earlier, but declined one at this time. Dr. Fanny Bien is reviewing CT scan.

## 2023-10-06 NOTE — ED Triage Notes (Addendum)
Pt to ED via POV from home. Pt reports lower abd pain that radiates to back since yesterday. Pt reports intermittent dark/tea colored urine and N/V. Pt with hx kidney stones. Pt denies injuries or exercise.

## 2023-10-19 ENCOUNTER — Encounter: Payer: Self-pay | Admitting: Orthopedic Surgery

## 2023-10-23 ENCOUNTER — Other Ambulatory Visit: Payer: Self-pay

## 2023-10-23 ENCOUNTER — Emergency Department
Admission: EM | Admit: 2023-10-23 | Discharge: 2023-10-23 | Disposition: A | Discharge status: Home / Self Care | Payer: 59 | Attending: Emergency Medicine | Admitting: Emergency Medicine

## 2023-10-23 DIAGNOSIS — Z20822 Contact with and (suspected) exposure to covid-19: Secondary | ICD-10-CM | POA: Diagnosis not present

## 2023-10-23 DIAGNOSIS — R197 Diarrhea, unspecified: Secondary | ICD-10-CM | POA: Insufficient documentation

## 2023-10-23 DIAGNOSIS — J449 Chronic obstructive pulmonary disease, unspecified: Secondary | ICD-10-CM | POA: Diagnosis not present

## 2023-10-23 DIAGNOSIS — R112 Nausea with vomiting, unspecified: Secondary | ICD-10-CM | POA: Insufficient documentation

## 2023-10-23 LAB — URINALYSIS, ROUTINE W REFLEX MICROSCOPIC
Bacteria, UA: NONE SEEN
Bilirubin Urine: NEGATIVE
Glucose, UA: NEGATIVE mg/dL
Ketones, ur: NEGATIVE mg/dL
Nitrite: NEGATIVE
Protein, ur: NEGATIVE mg/dL
Specific Gravity, Urine: 1.01 (ref 1.005–1.030)
pH: 6 (ref 5.0–8.0)

## 2023-10-23 LAB — CBC
HCT: 46.8 % — ABNORMAL HIGH (ref 36.0–46.0)
Hemoglobin: 15.7 g/dL — ABNORMAL HIGH (ref 12.0–15.0)
MCH: 32.3 pg (ref 26.0–34.0)
MCHC: 33.5 g/dL (ref 30.0–36.0)
MCV: 96.3 fL (ref 80.0–100.0)
Platelets: 241 10*3/uL (ref 150–400)
RBC: 4.86 MIL/uL (ref 3.87–5.11)
RDW: 12.1 % (ref 11.5–15.5)
WBC: 8.7 10*3/uL (ref 4.0–10.5)
nRBC: 0 % (ref 0.0–0.2)

## 2023-10-23 LAB — COMPREHENSIVE METABOLIC PANEL
ALT: 10 U/L (ref 0–44)
AST: 16 U/L (ref 15–41)
Albumin: 4.1 g/dL (ref 3.5–5.0)
Alkaline Phosphatase: 90 U/L (ref 38–126)
Anion gap: 9 (ref 5–15)
BUN: 8 mg/dL (ref 6–20)
CO2: 27 mmol/L (ref 22–32)
Calcium: 9.5 mg/dL (ref 8.9–10.3)
Chloride: 103 mmol/L (ref 98–111)
Creatinine, Ser: 0.8 mg/dL (ref 0.44–1.00)
GFR, Estimated: >60 mL/min (ref >60)
Glucose, Bld: 99 mg/dL (ref 70–99)
Potassium: 4 mmol/L (ref 3.5–5.1)
Sodium: 139 mmol/L (ref 135–145)
Total Bilirubin: 0.7 mg/dL (ref 0.0–1.2)
Total Protein: 7.8 g/dL (ref 6.5–8.1)

## 2023-10-23 LAB — RESP PANEL BY RT-PCR (RSV, FLU A&B, COVID)  RVPGX2
Influenza A by PCR: NEGATIVE
Influenza B by PCR: NEGATIVE
Resp Syncytial Virus by PCR: NEGATIVE
SARS Coronavirus 2 by RT PCR: NEGATIVE

## 2023-10-23 LAB — LIPASE, BLOOD: Lipase: 25 U/L (ref 11–51)

## 2023-10-23 MED ORDER — SODIUM CHLORIDE 0.9 % IV BOLUS
1000.0000 mL | Freq: Once | INTRAVENOUS | Status: AC
  Administered 2023-10-23: 1000 mL via INTRAVENOUS

## 2023-10-23 MED ORDER — ONDANSETRON 4 MG PO TBDP: 4.0000 mg | ORAL_TABLET | Freq: Three times a day (TID) | ORAL | 0 refills | Status: DC | PRN

## 2023-10-23 MED ORDER — ONDANSETRON HCL 4 MG/2ML IJ SOLN
4.0000 mg | Freq: Once | INTRAMUSCULAR | Status: AC
  Administered 2023-10-23: 4 mg via INTRAVENOUS
  Filled 2023-10-23: qty 2

## 2023-10-23 NOTE — Discharge Instructions (Signed)
 Your blood work and urine do not show any remarkable abnormalities.  You may take the Zofran  as needed for nausea, though do not take more than prescribed.  Please return for any new, worsening, or change in symptoms or other concerns as we discussed.  It was a pleasure caring for you today.

## 2023-10-23 NOTE — ED Triage Notes (Signed)
 Pt sts that she has been having N/V for the last three days and since then she has not been able to keep anything down.

## 2023-10-23 NOTE — ED Notes (Signed)
 See triage notes. Patient c/o N/V since Friday.

## 2023-10-23 NOTE — ED Provider Notes (Signed)
 [facility] Provider Note    Event Date/Time   First MD Initiated Contact with Patient 10/23/23 1245     (approximate)   History   Nausea and Vomiting   HPI  Tonya Miller is a 58 y.o. female who presents today for evaluation of nausea and vomiting for the past 3 days.  She reports that she also has a lot of diarrhea.  She denies abdominal pain specifically, though reports that she has some epigastric burning sensation.  No fevers or chills.  No dysuria or hematuria.  She reports that there is no one around her with the same symptoms.  Patient Active Problem List   Diagnosis Date Noted   Hypokalemia 01/29/2022   CAP (community acquired pneumonia) 01/29/2022   SOB (shortness of breath) 01/29/2022   Anxiety, generalized 11/30/2021   Panlobular emphysema (HCC) 11/30/2021   Chest pain of uncertain etiology 08/03/2021   Coronary artery disease of native artery of native heart with stable angina pectoris (HCC) 07/29/2021   Hyperlipidemia 07/29/2021   COPD (chronic obstructive pulmonary disease) (HCC) 07/29/2021   Tobacco abuse 07/29/2021   Pulmonary nodule 07/29/2021   NSTEMI (non-ST elevated myocardial infarction) (HCC) 07/28/2021   Subarachnoid hemorrhage (HCC) 09/05/2017          Physical Exam   Triage Vital Signs: ED Triage Vitals  Encounter Vitals Group     BP 10/23/23 1211 95/70     Systolic BP Percentile --      Diastolic BP Percentile --      Pulse Rate 10/23/23 1211 84     Resp 10/23/23 1211 18     Temp 10/23/23 1211 98.5 F (36.9 C)     Temp Source 10/23/23 1211 Oral     SpO2 --      Weight 10/23/23 1212 110 lb (49.9 kg)     Height 10/23/23 1212 5' 1 (1.549 m)     Head Circumference --      Peak Flow --      Pain Score 10/23/23 1212 0     Pain Loc --      Pain Education --      Exclude from Growth Chart --     Most recent vital signs: Vitals:   10/23/23 1211  BP: 95/70  Pulse: 84  Resp: 18  Temp: 98.5 F (36.9 C)     Physical Exam Vitals and nursing note reviewed.  Constitutional:      General: Awake and alert. No acute distress.    Appearance: Normal appearance. The patient is normal weight.  HENT:     Head: Normocephalic and atraumatic.     Mouth: Mucous membranes are moist.  Eyes:     General: PERRL. Normal EOMs        Right eye: No discharge.        Left eye: No discharge.     Conjunctiva/sclera: Conjunctivae normal.  Cardiovascular:     Rate and Rhythm: Normal rate and regular rhythm.     Pulses: Normal pulses.  Pulmonary:     Effort: Pulmonary effort is normal. No respiratory distress.     Breath sounds: Normal breath sounds.  Abdominal:     Abdomen is soft. There is no abdominal tenderness. No rebound or guarding. No distention. Musculoskeletal:        General: No swelling. Normal range of motion.     Cervical back: Normal range of motion and neck supple.  Skin:    General: Skin is  warm and dry.     Capillary Refill: Capillary refill takes less than 2 seconds.     Findings: No rash.  Neurological:     Mental Status: The patient is awake and alert.      ED Results / Procedures / Treatments   Labs (all labs ordered are listed, but only abnormal results are displayed) Labs Reviewed  CBC - Abnormal; Notable for the following components:      Result Value   Hemoglobin 15.7 (*)    HCT 46.8 (*)    All other components within normal limits  URINALYSIS, ROUTINE W REFLEX MICROSCOPIC - Abnormal; Notable for the following components:   Color, Urine YELLOW (*)    APPearance CLEAR (*)    Hgb urine dipstick SMALL (*)    Leukocytes,Ua SMALL (*)    All other components within normal limits  RESP PANEL BY RT-PCR (RSV, FLU A&B, COVID)  [id number]  LIPASE, BLOOD  COMPREHENSIVE METABOLIC PANEL     EKG     RADIOLOGY     PROCEDURES:  Critical Care performed:   Procedures   MEDICATIONS ORDERED IN ED: Medications  sodium chloride  0.9 % bolus 1,000 mL (0 mLs Intravenous  Stopped 10/23/23 1445)  ondansetron  (ZOFRAN ) injection 4 mg (4 mg Intravenous Given 10/23/23 1306)     IMPRESSION / MDM / ASSESSMENT AND PLAN / ED COURSE  I reviewed the triage vital signs and the nursing notes.   Differential diagnosis includes, but is not limited to, gastroenteritis, dehydration, electrolyte disarray.  Patient is awake and alert, hemodynamically stable and afebrile.  She has no reproducible abdominal tenderness on exam.  I reviewed the patient's chart.  Patient was seen in the emergency department on 10/06/2023 for flank pain and was diagnosed with renal calculus without ureteral calculus.  Patient is overall well-appearing today. No abdominal pain or tenderness. Vomiting and diarrhea are non-bloody. Patient is hemodynamically stable. No history of immunosuppression, no other red flags such as recent travel, sick contacts or recent antibiotic use.  EKG is nonischemic, also reviewed by attending MD.  She was treated symptomatically with Zofran  with resolution of her symptoms.  Her labs are overall reassuring.  Improved with treatment in the emergency department and tolerating oral intake. Differential diagnosis is broad however without focal abdominal tenderness and given improvement, no concern that the patient requires urgent imaging or has surgical process in abdomen.  Urinalysis is not highly suggestive of infection.  Given that patient has a paucity of red flags for the vomiting and diarrhea as it pertains to patient's past medical history and history of present illness, no indication for further observation or empiric antibiotic treatment. Discussed care plan, return precautions, and advised close outpatient follow-up. Patient agrees with plan of care.   Patient's presentation is most consistent with acute complicated illness / injury requiring diagnostic workup.   Clinical Course as of 10/23/23 1510  Mon Oct 23, 2023  1413 Currently drinking apple juice [JP]  1439 Patient  reports feeling significantly improved and able to tolerate p.o.  She feels ready for discharge home [JP]    Clinical Course User Index [JP] Dashel Goines E, PA-C     FINAL CLINICAL IMPRESSION(S) / ED DIAGNOSES   Final diagnoses:  Nausea vomiting and diarrhea     Rx / DC Orders   ED Discharge Orders          Ordered    ondansetron  (ZOFRAN -ODT) 4 MG disintegrating tablet  Every 8 hours PRN  10/23/23 1440             Note:  This document was prepared using Dragon voice recognition software and may include unintentional dictation errors.   Macenzie Burford E, PA-C 10/23/23 1510    Arlander Charleston, MD 10/23/23 [phone]

## 2023-11-10 ENCOUNTER — Ambulatory Visit: Payer: Self-pay | Admitting: Urology

## 2024-01-22 ENCOUNTER — Other Ambulatory Visit: Payer: Self-pay

## 2024-01-22 ENCOUNTER — Emergency Department
Admission: EM | Admit: 2024-01-22 | Discharge: 2024-01-22 | Disposition: A | Discharge status: Home / Self Care | Attending: Emergency Medicine | Admitting: Emergency Medicine

## 2024-01-22 ENCOUNTER — Emergency Department

## 2024-01-22 DIAGNOSIS — M5442 Lumbago with sciatica, left side: Secondary | ICD-10-CM | POA: Insufficient documentation

## 2024-01-22 DIAGNOSIS — G8929 Other chronic pain: Secondary | ICD-10-CM | POA: Insufficient documentation

## 2024-01-22 DIAGNOSIS — J449 Chronic obstructive pulmonary disease, unspecified: Secondary | ICD-10-CM | POA: Diagnosis not present

## 2024-01-22 DIAGNOSIS — M549 Dorsalgia, unspecified: Secondary | ICD-10-CM | POA: Diagnosis present

## 2024-01-22 MED ORDER — NAPROXEN 500 MG PO TBEC
500.0000 mg | DELAYED_RELEASE_TABLET | Freq: Two times a day (BID) | ORAL | 0 refills | Status: DC
Start: 2024-01-22 — End: 2024-04-17

## 2024-01-22 MED ORDER — KETOROLAC TROMETHAMINE 30 MG/ML IJ SOLN
30.0000 mg | Freq: Once | INTRAMUSCULAR | Status: AC
  Administered 2024-01-22: 30 mg via INTRAMUSCULAR
  Filled 2024-01-22: qty 1

## 2024-01-22 MED ORDER — PREDNISONE 50 MG PO TABS: ORAL_TABLET | ORAL | 0 refills | Status: DC

## 2024-01-22 MED ORDER — PREDNISONE 20 MG PO TABS
60.0000 mg | ORAL_TABLET | Freq: Once | ORAL | Status: AC
  Administered 2024-01-22: 60 mg via ORAL
  Filled 2024-01-22: qty 3

## 2024-01-22 NOTE — Discharge Instructions (Addendum)
 You have been diagnosed with chronic lower back pain.  Please take naproxen 1 tablet by mouth 2 times daily with meals every 12 hours.  Please take prednisone 1 tablet with breakfast for the next 4 days.  Call and make an appointment with Dr. Gerlene Fee.  Please come back to ED or go to your PCP if you have new symptoms or symptoms worsen.

## 2024-01-22 NOTE — ED Provider Notes (Signed)
 [facility] Provider Note    Event Date/Time   First MD Initiated Contact with Patient 01/22/24 1727     (approximate)   History   Back Pain    HPI  Tonya Miller is a 58 y.o. female   with a past medical history of chronic back pain, L1 compression fracture, chronic cervical spondylosis,COPD,  cardiac disease, subarachnoid hemorrhage  presents to the emergency department for back pain in the last 6 months.    According to the patient, back pain is getting worse in the last week, today at work her legs were weak and she almost fall.  Patient states pain radiates to the left lower extremity.  Patient also states having dizziness, nauseas associated to cervical pain.  Patient is taking muscle relaxant without any improvement.  Patient denies urinary incontinence, fecal incontinence, symptoms of saddle anesthesia.        Physical Exam   Triage Vital Signs: ED Triage Vitals  Encounter Vitals Group     BP 01/22/24 1528 118/82     Systolic BP Percentile --      Diastolic BP Percentile --      Pulse Rate 01/22/24 1528 73     Resp 01/22/24 1528 20     Temp 01/22/24 1528 98.4 F (36.9 C)     Temp Source 01/22/24 1528 Oral     SpO2 01/22/24 1528 100 %     Weight --      Height 01/22/24 1527 5\' 1"  (1.549 m)     Head Circumference --      Peak Flow --      Pain Score 01/22/24 1527 10     Pain Loc --      Pain Education --      Exclude from Growth Chart --     Most recent vital signs: Vitals:   01/22/24 1528  BP: 118/82  Pulse: 73  Resp: 20  Temp: 98.4 F (36.9 C)  SpO2: 100%     Constitutional: Alert, NAD. Able to speak in complete sentences without cough or dyspnea  Eyes: Conjunctivae are normal.  Head: Atraumatic. Nose: No congestion/rhinnorhea. Mouth/Throat: Mucous membranes are moist.   Neck: Painless ROM. Supple. No JVD, nodes, thyromegaly  Cardiovascular:   Good peripheral circulation.RRR no murmurs, gallops, rubs  Respiratory:  Normal respiratory effort.  No retractions. Clear to auscultation bilaterally without wheezing or crackles  Gastrointestinal: Soft and nontender.  Musculoskeletal:  no deformity Cervical spine: Tender to palpation bilateral cervical area.  Thoracic spine: Skin is intact, no ecchymosis or hematomas.  Paraspinal muscles tender to palpation. Lumbar spine: Skin is intact, paraspinal muscles tender to palpation. Neurologic:  MAE spontaneously. No gross focal neurologic deficits are appreciated.  Saddle anesthesia negative.  Skin:  Skin is warm, dry and intact. No rash noted. Psychiatric: Mood and affect are normal. Speech and behavior are normal.    ED Results / Procedures / Treatments   Labs (all labs ordered are listed, but only abnormal results are displayed) Labs Reviewed  POC URINE PREG, ED     EKG     RADIOLOGY I independently reviewed and interpreted imaging and agree with radiologists findings.      PROCEDURES:  Critical Care performed:   Procedures   MEDICATIONS ORDERED IN ED: Medications  ketorolac (TORADOL) 30 MG/ML injection 30 mg (30 mg Intramuscular Given 01/22/24 1842)  predniSONE (DELTASONE) tablet 60 mg (60 mg Oral Given 01/22/24 1841)      IMPRESSION /  MDM / ASSESSMENT AND PLAN / ED COURSE  I reviewed the triage vital signs and the nursing notes.  Differential diagnosis includes, but is not limited to, lumbar stenosis, cervical stenosis, fracture, strain muscle, cauda equina  Patient's presentation is most consistent with acute complicated illness / injury requiring diagnostic workup.   Patient's diagnosis is consistent with chronic back pain.  At physical exam I ruled out cauda equina.  I did order lumbar CT scan but patient does not want to wait for results.  Patient states will check on MyChart for results.  During admission patient received prednisone and Toradol for pain with improvement of her symptoms.  I will refer her to Dr. Aliene Beams her  previous neurosurgeon.  I did review the patient's allergies and medications.The patient is in stable and satisfactory condition for discharge home  Patient will be discharged home with prescriptions for naproxen, prednisone. Patient is to follow up with Dr. Gerlene Fee and PCP as needed or otherwise directed. Patient is given ED precautions to return to the ED for any worsening or new symptoms. Discussed plan of care with patient, answered all of patient's questions, Patient agreeable to plan of care. Advised patient to take medications according to the instructions on the label. Discussed possible side effects of new medications. Patient verbalized understanding.    FINAL CLINICAL IMPRESSION(S) / ED DIAGNOSES   Final diagnoses:  Chronic midline low back pain with left-sided sciatica     Rx / DC Orders   ED Discharge Orders          Ordered    predniSONE (DELTASONE) 50 MG tablet        01/22/24 1907    naproxen (EC NAPROSYN) 500 MG EC tablet  2 times daily with meals        01/22/24 1907             Note:  This document was prepared using Dragon voice recognition software and may include unintentional dictation errors.   Gladys Damme, PA-C 01/22/24 Windell Moment    Dionne Bucy, MD 01/22/24 2351

## 2024-01-22 NOTE — ED Triage Notes (Signed)
 Patient states chronic low back pain for about 6 months

## 2024-02-14 NOTE — Progress Notes (Addendum)
 Referring Physician:  Yehuda Helms, MD [address] [facility] [place],  [place] [address]  Primary Physician:  Yehuda Helms, MD  History of Present Illness: [name] has a history of chronic back pain, post-Covid syndrome, prediabetes, NSTEMI, CAD, COPD, emphysema, hyperlipidemia,   History of L1 kyphoplasty as well.   Last seen by me on 07/20/23 for neck and left arm pain along with LBP and bilateral leg pain. She has known cervical spondylosis/DDD, worse at C5-C6. Also with known mild compression of L2 age indeterminate, retrolisthesis L2-L3 and L3-L4, along with lumbar DDD and spondylosis.   Cervical and lumbar MRIs were ordered along with lumbar xrays- not done as insurance denied. PT was ordered and she was lost to follow up.   [facility].   Seen in ED on 01/22/24 for LBP with left leg pain and weakness along with neck pain.   She has constant neck pain with constant left arm pain to her hand. She has intermittent right arm pain to her hand as well. She has constant numbness/tingling in left arm- feels "asleep." She notes weakness in her left hand. She can still use hands, but things take longer.   She has constant LBP with bilateral posterior leg pain  (entire leg) to her feet, left leg > right leg. No specific alleviating factors. She has weakness in both legs- they feel heavy. Pain is worse with stairs- legs feel like rubber, this happens with walking but is not as bad. She has numbness and tingling in both legs.   Given prednisone  and naproxen  from ED. Did not see much relief with these. She is taking prn zanaflex.   She is still on PLAVIX , but ran out of it 6 months ago.   She smokes 1/2 PPD x 30 years.   Bowel/Bladder Dysfunction: none  Conservative measures:  Physical therapy: [facility] last fall Multimodal medical therapy including regular antiinflammatories: tylenol , zanaflex, prednisone ,  naproxen  Injections: No epidural steroid injections  Past Surgery:  Had L1 kyphoplasty as well.   Tonya Miller has no symptoms of cervical myelopathy.  The symptoms are causing a significant impact on the patient's life.   Review of Systems:  A 10 point review of systems is negative, except for the pertinent positives and negatives detailed in the HPI.  Past Medical History: Past Medical History:  Diagnosis Date   Anxiety    COPD (chronic obstructive pulmonary disease) (HCC)    Muscle spasm     Past Surgical History: Past Surgical History:  Procedure Laterality Date   BACK SURGERY     kyphoplasty   BACK SURGERY     CESAREAN SECTION     CORONARY STENT INTERVENTION N/A 07/28/2021   Procedure: CORONARY STENT INTERVENTION;  Surgeon: Arnoldo Lapping, MD;  Location: [facility] INVASIVE CV LAB;  Service: Cardiovascular;  Laterality: N/A;   LEFT HEART CATH AND CORONARY ANGIOGRAPHY N/A 07/28/2021   Procedure: LEFT HEART CATH AND CORONARY ANGIOGRAPHY;  Surgeon: Arnoldo Lapping, MD;  Location: [facility] INVASIVE CV LAB;  Service: Cardiovascular;  Laterality: N/A;    Allergies: Allergies as of 02/15/2024   (No Known Allergies)    Medications: Outpatient Encounter Medications as of 02/15/2024  Medication Sig   acetaminophen  (TYLENOL ) 500 MG tablet Take 1,000 mg by mouth every 6 (six) hours as needed for moderate pain.   albuterol  (VENTOLIN  HFA) 108 (90 Base) MCG/ACT inhaler Inhale 2 puffs into the lungs every 6 (six)  hours as needed for wheezing or shortness of breath.   atorvastatin  (LIPITOR ) 80 MG tablet Take 1 tablet (80 mg total) by mouth daily.   busPIRone (BUSPAR) 10 MG tablet Take 10 mg by mouth 2 (two) times daily as needed.   clopidogrel  (PLAVIX ) 75 MG tablet Take 1 tablet (75 mg total) by mouth daily.   isosorbide  mononitrate (IMDUR ) 30 MG 24 hr tablet Take 1 tablet (30 mg total) by mouth daily.   naproxen  (EC NAPROSYN ) 500 MG EC tablet Take 1 tablet (500 mg total) by mouth 2 (two) times  daily with a meal.   nicotine  (NICODERM CQ  - DOSED IN MG/24 HOURS) 21 mg/24hr patch Place 1 patch (21 mg total) onto the skin daily.   nitroGLYCERIN  (NITROSTAT ) 0.4 MG SL tablet Place 1 tablet (0.4 mg total) under the tongue every 5 (five) minutes as needed for chest pain.   ondansetron  (ZOFRAN -ODT) 4 MG disintegrating tablet Take 1 tablet (4 mg total) by mouth every 6 (six) hours as needed for nausea or vomiting.   ondansetron  (ZOFRAN -ODT) 4 MG disintegrating tablet Take 1 tablet (4 mg total) by mouth every 8 (eight) hours as needed.   predniSONE  (DELTASONE ) 50 MG tablet Please take one tablet with breakfast   Vitamin D, Ergocalciferol, (DRISDOL) 1.25 MG (50000 UNIT) CAPS capsule Take 50,000 Units by mouth once a week.   No facility-administered encounter medications on file as of 02/15/2024.    Social History: Social History   Tobacco Use   Smoking status: Every Day    Current packs/day: 0.50    Types: Cigarettes   Smokeless tobacco: Never  Vaping Use   Vaping status: Some Days  Substance Use Topics   Alcohol use: No   Drug use: Yes    Types: Marijuana    Comment: occasionally for nausea and anxiety    Family Medical History: Family History  Problem Relation Age of Onset   Heart failure Mother    Heart disease Brother     Physical Examination: There were no vitals filed for this visit.    Awake, alert, oriented to person, place, and time.  Speech is clear and fluent. Fund of knowledge is appropriate.   Cranial Nerves: Pupils equal round and reactive to light.  Facial tone is symmetric.    No posterior cervical tenderness. Mild tenderness in bilateral trapezial region.   Diffuse posterior lumbar tenderness.   No abnormal lesions on exposed skin.   Strength: Side Biceps Triceps Deltoid Interossei Grip Wrist Ext. Wrist Flex.  R 5 5 5 5 5 5 5   L 5 5 5 5 5 5 5    Side Iliopsoas Quads Hamstring PF DF EHL  R 5 5 5 5 5 5   L 5 5 5 5 5 5    Reflexes are 2+ and symmetric  at the biceps, brachioradialis, patella and achilles.   Hoffman's is absent.  Clonus is not present.   Bilateral upper and lower extremity sensation is intact to light touch.     She can heel stand bilaterally. She can toe stand on right/leg independently.   Gait is normal.     Medical Decision Making  Imaging: Lumbar xrays dated 01/22/24:  FINDINGS: Five non-rib-bearing lumbar vertebra. Chronic L1 compression fracture post vertebral augmentation, stable in appearance. Chronic L2 compression fracture, stable in appearance from prior exam. No evidence of acute compression fracture. L2-L3 and L3-L4 degenerative disc disease. The sacroiliac joints are congruent. The bones are subjectively under mineralized.   IMPRESSION: 1. Chronic  L1 and L2 compression fractures post vertebral augmentation at L1. No acute fracture. 2. L2-L3 and L3-L4 degenerative disc disease.     Electronically Signed   By: Chadwick Colonel M.D.   On: 01/22/2024 21:03    Assessment and Plan: Ms. Sheils is a pleasant 58 y.o. female who has a history of L1 kyphoplasty.   She has constant neck pain with constant left arm pain to her hand. She has intermittent right arm pain to her hand as well. She has constant numbness/tingling in left arm- feels "asleep." She notes weakness in her left hand.   She has known cervical spondylosis/DDD, worse at C5-C6.   She has constant LBP with bilateral posterior leg pain  (entire leg) to her feet, left leg > right leg. She has weakness in both legs- they feel heavy. She has numbness and tingling in both legs.  She has known mild compression of L2 age indeterminate with history of L1 kyphoplasty, retrolisthesis L2-L3 and L3-L4, along with lumbar DDD and spondylosis. She has some increased kyphosis above fractures L1-L2.   Treatment options discussed with patient and following plan made:   - Will get PT notes from [facility]. If she has been discharged then will order MRI of  cervical and lumbar spine to further evaluate cervical and lumbar radiculopathy. Will get at [facility].  - Full length scoliosis xrays to evaluate kyphosis above fracture at L1/L2. Will get flex/ext lumbar xrays as well. Can get these when she has MRIs (if approved).  - Message to her PCP. She has been off PLAVIX  for 6 months as she ran out of it.  - Given work note to be out until Saturday.  - Will schedule phone visit to review MRI/xray results once I get them back.  - Will message to let her know when I get PT notes from [facility].   I spent a total of 30 minutes in face-to-face and non-face-to-face activities related to this patient's care today including review of outside records, review of imaging, review of symptoms, physical exam, discussion of differential diagnosis, discussion of treatment options, and documentation.     Lucetta Russel PA-C Dept. of Neurosurgery

## 2024-02-15 ENCOUNTER — Ambulatory Visit: Admitting: Orthopedic Surgery

## 2024-02-15 ENCOUNTER — Encounter: Payer: Self-pay | Admitting: Orthopedic Surgery

## 2024-02-15 VITALS — BP 118/84 | Ht 61.0 in | Wt 110.0 lb

## 2024-02-15 DIAGNOSIS — M5412 Radiculopathy, cervical region: Secondary | ICD-10-CM

## 2024-02-15 DIAGNOSIS — M5030 Other cervical disc degeneration, unspecified cervical region: Secondary | ICD-10-CM

## 2024-02-15 DIAGNOSIS — M50322 Other cervical disc degeneration at C5-C6 level: Secondary | ICD-10-CM | POA: Diagnosis not present

## 2024-02-15 DIAGNOSIS — M47816 Spondylosis without myelopathy or radiculopathy, lumbar region: Secondary | ICD-10-CM

## 2024-02-15 DIAGNOSIS — M4722 Other spondylosis with radiculopathy, cervical region: Secondary | ICD-10-CM | POA: Diagnosis not present

## 2024-02-15 DIAGNOSIS — M51362 Other intervertebral disc degeneration, lumbar region with discogenic back pain and lower extremity pain: Secondary | ICD-10-CM | POA: Diagnosis not present

## 2024-02-15 DIAGNOSIS — M47812 Spondylosis without myelopathy or radiculopathy, cervical region: Secondary | ICD-10-CM

## 2024-02-15 DIAGNOSIS — M40295 Other kyphosis, thoracolumbar region: Secondary | ICD-10-CM

## 2024-02-15 DIAGNOSIS — M5416 Radiculopathy, lumbar region: Secondary | ICD-10-CM

## 2024-02-15 DIAGNOSIS — M4726 Other spondylosis with radiculopathy, lumbar region: Secondary | ICD-10-CM

## 2024-02-15 DIAGNOSIS — Z8781 Personal history of (healed) traumatic fracture: Secondary | ICD-10-CM

## 2024-02-15 NOTE — Patient Instructions (Signed)
 It was so nice to see you today. Thank you so much for coming in.    You have wear and tear in both your neck and your back.   I have requested your PT notes from [facility]. Once I get these, will let you know.   Plan is to order MRI of cervical and lumbar spine along with full length scoliosis xrays of your spine.   We will get the MRIs approved through your insurance and [facility] will call you to schedule the appointment. Ask about your patient responsibility. You do not need to pay this prior to getting MRI, they can bill you.   When you have the MRI scans done, remind them to do the xrays.   After you have the MRIs and xrays, it takes 14-21 days for me to get the results back. Once I have them, we will call you to schedule a follow up phone visit with me to review them.   I sent a message to Dr. Claudius Cumins about your PLAVIX . Call if you don't hear from him.   Let me know if you need a work note.   Please do not hesitate to call if you have any questions or concerns. You can also message me in MyChart.   Lucetta Russel PA-C [phone]     The physicians and staff at [facility] are committed to providing excellent care. You may receive a survey asking for feedback about your experience at our office. We value you your feedback and appreciate you taking the time to to fill it out. The [facility] leadership team is also available to discuss your experience in person, feel free to contact us  [phone].

## 2024-02-26 ENCOUNTER — Telehealth: Payer: Self-pay | Admitting: Orthopedic Surgery

## 2024-02-26 DIAGNOSIS — M47816 Spondylosis without myelopathy or radiculopathy, lumbar region: Secondary | ICD-10-CM

## 2024-02-26 DIAGNOSIS — M47812 Spondylosis without myelopathy or radiculopathy, cervical region: Secondary | ICD-10-CM

## 2024-02-26 DIAGNOSIS — M5030 Other cervical disc degeneration, unspecified cervical region: Secondary | ICD-10-CM

## 2024-02-26 DIAGNOSIS — M5416 Radiculopathy, lumbar region: Secondary | ICD-10-CM

## 2024-02-26 DIAGNOSIS — Z8781 Personal history of (healed) traumatic fracture: Secondary | ICD-10-CM

## 2024-02-26 DIAGNOSIS — M5412 Radiculopathy, cervical region: Secondary | ICD-10-CM

## 2024-02-26 NOTE — Telephone Encounter (Signed)
 Patient is calling asking if we had received her PT note from [facility]. I told her that the only note we have is from 09/19/2023. She states that was her first and only visit with PT. That note is scanned. Can you use that note to order her MRIs? She is in a lot of pain. She is taking naproxen  and that is not helping. Is there something else you can prescribe her to get through the day. She works 6 days a week. [employer] [address]

## 2024-02-27 NOTE — Telephone Encounter (Signed)
 Tonya Miller- did we submit this PT note for her lumbar spine MRI? It looks like it may be a discharge note. Let me know so I can let her know.    Given prednisone  and naproxen  from ED. Did not see much relief with these. She is taking prn zanaflex.   She is supposed to be seeing PCP about restarting PLAVIX .   Can try changing her muscle relaxer to flexeril or robaxin . Does she want to try one of these?

## 2024-02-29 MED ORDER — METHOCARBAMOL 500 MG PO TABS: 500.0000 mg | ORAL_TABLET | Freq: Three times a day (TID) | ORAL | 0 refills | Status: DC | PRN

## 2024-02-29 NOTE — Addendum Note (Signed)
 Addended byLucetta Russel on: 02/29/2024 04:15 PM   Modules accepted: Orders

## 2024-03-01 NOTE — Telephone Encounter (Signed)
 Noted.

## 2024-03-01 NOTE — Telephone Encounter (Signed)
 Her PT was only for lumbar, so I'm not sure it would help with cervical. I guess you can try.

## 2024-03-12 NOTE — Addendum Note (Signed)
 Addended by: Demetries Coia W on: 03/12/2024 08:26 AM   Modules accepted: Orders

## 2024-03-19 ENCOUNTER — Encounter: Payer: Self-pay | Admitting: Internal Medicine

## 2024-03-19 ENCOUNTER — Ambulatory Visit
Admission: RE | Admit: 2024-03-19 | Discharge: 2024-03-19 | Disposition: A | Discharge status: Home / Self Care | Source: Ambulatory Visit | Attending: Orthopedic Surgery | Admitting: Orthopedic Surgery

## 2024-03-19 DIAGNOSIS — M47816 Spondylosis without myelopathy or radiculopathy, lumbar region: Secondary | ICD-10-CM

## 2024-03-19 DIAGNOSIS — Z8781 Personal history of (healed) traumatic fracture: Secondary | ICD-10-CM

## 2024-03-19 DIAGNOSIS — M5030 Other cervical disc degeneration, unspecified cervical region: Secondary | ICD-10-CM

## 2024-03-19 DIAGNOSIS — M5416 Radiculopathy, lumbar region: Secondary | ICD-10-CM

## 2024-03-19 DIAGNOSIS — M47812 Spondylosis without myelopathy or radiculopathy, cervical region: Secondary | ICD-10-CM

## 2024-03-19 DIAGNOSIS — M5412 Radiculopathy, cervical region: Secondary | ICD-10-CM

## 2024-03-25 ENCOUNTER — Ambulatory Visit
Admission: RE | Admit: 2024-03-25 | Discharge: 2024-03-25 | Disposition: A | Discharge status: Home / Self Care | Source: Ambulatory Visit | Attending: Orthopedic Surgery | Admitting: Orthopedic Surgery

## 2024-03-25 DIAGNOSIS — M5412 Radiculopathy, cervical region: Secondary | ICD-10-CM | POA: Insufficient documentation

## 2024-03-25 DIAGNOSIS — M47812 Spondylosis without myelopathy or radiculopathy, cervical region: Secondary | ICD-10-CM | POA: Insufficient documentation

## 2024-03-25 DIAGNOSIS — Z8781 Personal history of (healed) traumatic fracture: Secondary | ICD-10-CM | POA: Insufficient documentation

## 2024-03-25 DIAGNOSIS — M5030 Other cervical disc degeneration, unspecified cervical region: Secondary | ICD-10-CM | POA: Diagnosis present

## 2024-03-25 DIAGNOSIS — M5416 Radiculopathy, lumbar region: Secondary | ICD-10-CM | POA: Insufficient documentation

## 2024-03-25 DIAGNOSIS — M47816 Spondylosis without myelopathy or radiculopathy, lumbar region: Secondary | ICD-10-CM | POA: Insufficient documentation

## 2024-04-11 ENCOUNTER — Ambulatory Visit: Payer: Self-pay | Admitting: Neurosurgery

## 2024-04-16 NOTE — Progress Notes (Unsigned)
 Referring Physician:  Auston Reyes BIRCH, MD [address] [facility] [place],  [place] [address]  Primary Physician:  Auston Reyes BIRCH, MD  History of Present Illness: Ms. Edita Weyenberg has a history of chronic back pain, post-Covid syndrome, prediabetes, NSTEMI, CAD, COPD, emphysema, hyperlipidemia,   History of L1 kyphoplasty as well.   Last seen by me on 02/15/24 for neck and back pain.   She has constant neck pain with constant left arm pain to her hand and intermittent right arm pain to her hand.   She has known cervical spondylosis/DDD, worse at C5-C6.   She also has constant LBP with bilateral posterior leg pain  (entire leg) to her feet with weakness in both legs- they feel heavy. She has numbness and tingling in both legs.  She has known mild compression of L2 age indeterminate with history of L1 kyphoplasty, retrolisthesis L2-L3 and L3-L4, along with lumbar DDD and spondylosis. She has some increased kyphosis above fractures L1-L2.   MRI of her cervical and lumbar spine were ordered along with scoliosis xrays. She is here to review them.   She is the same. She has constant neck pain with constant left arm pain to her hand. She has intermittent right arm pain to her hand that is more constant now. She has constant numbness/tingling in left arm- feels asleep. She notes weakness in her left hand.   She has constant LBP with bilateral posterior leg pain  (entire leg) to her feet, left leg > right leg. She has weakness in both legs- they feel heavy. Pain is worse with stairs and prolonged walking. She has numbness and tingling in both legs.   No relief with naproxen , not sure if robaxin  helped.   She is still on PLAVIX , but ran out of it 6-8 months ago. Has appt with PCP on 04/25/24.   She smokes 1/2 PPD x 30 years. She is trying to quit.   Bowel/Bladder Dysfunction: none  Conservative measures:  Physical therapy: PT at [facility] on 09/20/23- PT referred her back  to us  due to severity of symptoms.  Multimodal medical therapy including regular antiinflammatories: tylenol , zanaflex, prednisone , naproxen  Injections: No epidural steroid injections  Past Surgery:  Had L1 kyphoplasty as well.   Hendricks BIRCH Larve has no symptoms of cervical myelopathy.  The symptoms are causing a significant impact on the patient's life.   Review of Systems:  A 10 point review of systems is negative, except for the pertinent positives and negatives detailed in the HPI.  Past Medical History: Past Medical History:  Diagnosis Date   Anxiety    COPD (chronic obstructive pulmonary disease) (HCC)    Muscle spasm     Past Surgical History: Past Surgical History:  Procedure Laterality Date   BACK SURGERY     kyphoplasty   BACK SURGERY     CESAREAN SECTION     CORONARY STENT INTERVENTION N/A 07/28/2021   Procedure: CORONARY STENT INTERVENTION;  Surgeon: Wonda Sharper, MD;  Location: [facility] INVASIVE CV LAB;  Service: Cardiovascular;  Laterality: N/A;   LEFT HEART CATH AND CORONARY ANGIOGRAPHY N/A 07/28/2021   Procedure: LEFT HEART CATH AND CORONARY ANGIOGRAPHY;  Surgeon: Wonda Sharper, MD;  Location: [facility] INVASIVE CV LAB;  Service: Cardiovascular;  Laterality: N/A;    Allergies: Allergies as of 04/17/2024   (No Known Allergies)    Medications: Outpatient Encounter Medications as of 04/17/2024  Medication Sig   acetaminophen  (TYLENOL ) 500 MG tablet Take 1,000 mg by  mouth every 6 (six) hours as needed for moderate pain.   busPIRone (BUSPAR) 10 MG tablet Take 10 mg by mouth 2 (two) times daily as needed.   isosorbide  mononitrate (IMDUR ) 30 MG 24 hr tablet Take 1 tablet (30 mg total) by mouth daily.   methocarbamol  (ROBAXIN ) 500 MG tablet Take 1 tablet (500 mg total) by mouth every 8 (eight) hours as needed for muscle spasms.   naproxen  (EC NAPROSYN ) 500 MG EC tablet Take 1 tablet (500 mg total) by mouth 2 (two) times daily with a meal.   No facility-administered  encounter medications on file as of 04/17/2024.    Social History: Social History   Tobacco Use   Smoking status: Every Day    Current packs/day: 0.50    Types: Cigarettes   Smokeless tobacco: Never  Vaping Use   Vaping status: Some Days  Substance Use Topics   Alcohol use: No   Drug use: Yes    Types: Marijuana    Comment: occasionally for nausea and anxiety    Family Medical History: Family History  Problem Relation Age of Onset   Heart failure Mother    Heart disease Brother     Physical Examination: There were no vitals filed for this visit.    Awake, alert, oriented to person, place, and time.  Speech is clear and fluent. Fund of knowledge is appropriate.   Cranial Nerves: Pupils equal round and reactive to light.  Facial tone is symmetric.    Diffuse posterior lumbar tenderness.   No abnormal lesions on exposed skin.   Strength: Side Biceps Triceps Deltoid Interossei Grip Wrist Ext. Wrist Flex.  R 5 5 5 5 5 5 5   L 5 5 5 5 5 5 5    Side Iliopsoas Quads Hamstring PF DF EHL  R 5 5 5 5 5 5   L 5 5 5 5 5 5    Reflexes are 2+ and symmetric at the biceps, brachioradialis, patella and achilles.   Hoffman's is absent.  Clonus is not present.   Bilateral upper and lower extremity sensation is intact to light touch.     Gait is normal.     Medical Decision Making  Imaging: Cervical MRI dated 03/19/24:  FINDINGS: Alignment: Straightening of the normal cervical lordosis. No significant listhesis.   Vertebrae: Vertebral body heights are maintained. No bone marrow edema or evidence of fracture. T2 hyperintense foci in the C7 vertebral body and in the upper thoracic spine likely reflecting hemangiomas.   Cord: Normal signal and morphology.   Posterior Fossa, vertebral arteries, paraspinal tissues: Posterior fossa is unremarkable. Bilateral vertebral artery flow voids are visualized. Paraspinal soft tissues are unremarkable.   Disc   C2-3: No significant  spinal canal stenosis. No significant foraminal stenosis.   C3-4: Small right paracentral disc protrusion which indents the ventral thecal sac without contacting the spinal cord. No significant spinal canal or foraminal stenosis.   C4-5: Disc osteophyte complex eccentric to the right which is slightly increased from prior and indents the ventral thecal sac with subtle flattening of the ventral cervical cord. No significant foraminal stenosis.   C5-6: No significant spinal canal stenosis. No significant foraminal stenosis.   C6-7: Small disc osteophyte complex indents the ventral thecal sac without contacting the spinal cord. Bilateral facet arthrosis. Uncovertebral hypertrophy on the left. Moderate foraminal stenosis on the left is increased from prior.   C7-T1: No significant spinal canal stenosis. No significant foraminal stenosis.   IMPRESSION: Degenerative changes as above.  Disc osteophyte complex at C4-5 is slightly increased from prior. Subtle flattening of the ventral cervical cord at C4-5 without cord signal abnormality.   Moderate foraminal stenosis on the left at C6-7 is increased from prior.     Electronically Signed   By: Donnice Mania M.D.   On: 04/12/2024 12:36   Lumbar MRI dated 03/19/24:   FINDINGS: Moderate L1 and L2 chronic vertebral body fractures. Cement in the L1 fracture. Focal kyphosis at this level. The alignment is otherwise unremarkable. Mild degenerative disc disease throughout which is more moderate at T12-L1. No endplate marrow edema. The marrow signal is unremarkable as well as the conus and cauda equina.   No disc protrusion/extrusion. No levels of degenerative central stenosis or foraminal narrowing.   The imaged retroperitoneal structures are unremarkable.     IMPRESSION: Chronic L1 and L2 vertebral body fractures. Focal kyphosis at this level.   No disc protrusion or degenerative stenosis.   Electronically signed by: Reyes Frees MD 04/09/2024 10:23 PM EDT RP Workstation: [id number]   Scoliosis xrays dated 03/25/24:  FINDINGS: Total of 8 views including whole-body AP and lateral views, AP and lateral cervical spine, AP and lateral views centered on the lower thoracic and lumbar region and lumbosacral spot lateral view. AP pelvis is also provided.   Cervical spine:   Reversed cervical lordosis is centered at C4-5, otherwise normal alignment.   The cervical discs are degenerated, particularly at C4-5, C5-6 and C6-7 where there are bidirectional endplate spurs. There is facet hypertrophy most levels and uncinate spurring.   No precervical soft tissue thickening or fractures are seen.   There are chunky calcifications at the carotid bifurcations.   Thoracic spine:   No AP listhesis. There is 13 degrees of levoscoliosis measured from the top of T1 to the bottom of T6, apex at T4. The thoracic alignment is otherwise normal.   The thoracic vertebra are normal in heights without evidence of fractures. There are mild degenerative changes, mild osteopenia.   Lumbar spine:   There is no scoliosis. Again noted grade 1 L2-3 and L3-4 retrolisthesis, unchanged.   There is exaggerated kyphotic angle centered at the L1 level measuring 49 degrees, unchanged.   There is a chronic moderate anterior wedge compression fracture deformity of L1 treated with kyphoplasty, moderate chronic anterior wedging of L2 without kyphoplasty, with moderate lumbar spondylosis noted present. L3-5 are normal in height.   There is facet joint spurring from L3-4 down but no bulky facet hypertrophy. The SI joints unremarkable, as visualized.   Other:   Chest: The lungs are emphysematous with scarring changes in the upper lobes and chronic interstitial changes in the lower lung fields.   No pneumonia is seen. The aorta is tortuous with age advanced calcifications. The cardiac size is normal.   Abdomen: Scattered  nonobstructing caliceal stones in the left kidney, largest is 3 mm. The bowel pattern is nonobstructive.   No free air is seen.  The abdominal aorta is heavily calcified.   AP pelvis: No pelvic fracture or diastasis is seen, no other focal bone abnormality.   There are scattered calcific plaques in the femoral arteries. This also is greater than typical for age.   IMPRESSION: 1. 13 degrees of levoscoliosis upper thoracic spine, apex at T4. 2. Reversed cervical lordosis centered at C4-5 with degenerative changes. 3. No evidence of cervical fractures. 4. No acute the the lumbar fractures. 5. Chronic moderate anterior wedge compression fracture deformities of L1 and L2, treated  with L1 kyphoplasty. 6. Exaggerated kyphotic angle centered at L1 measuring 49 degrees, unchanged. 7. Grade 1 L2-3 and L3-4 retrolisthesis, unchanged. 8. Emphysema with scarring changes in the upper lobes and chronic interstitial changes in the lower lung fields. 9. Age advanced vascular calcifications. 10. Nonobstructing left renal caliceal stones.     Electronically Signed   By: Francis Quam M.D.   On: 03/31/2024 22:52   I have personally reviewed the images and agree with the above interpretation.   Assessment and Plan: Ms. Luebke is a pleasant 58 y.o. female who has a history of L1 kyphoplasty.   She has constant neck pain with constant left arm pain to her hand. She has intermittent right arm pain to her hand that is more constant now. She has constant numbness/tingling in left arm- feels asleep. She notes weakness in her left hand.   She has known cervical spondylosis/DDD, worse at C5-C6. She has right sided spur/disc C4-C5 and moderate left foraminal stenosis C6-C7.    She has constant LBP with bilateral posterior leg pain  (entire leg) to her feet, left leg > right leg. She has weakness in both legs- they feel heavy. She has numbness and tingling in both legs.   She has known mild  compression of L2 age indeterminate with history of L1 kyphoplasty, retrolisthesis L2-L3 and L3-L4, along with lumbar DDD and spondylosis. She has some increased kyphosis above fractures L1-L2 with instability noted on flex/ext.   Treatment options discussed with patient and following plan made:   - Referral to pain management (Lateef) to consider cervical injections.  - Lumbar imaging reviewed with Dr. Claudene. She has increased kyphosis above L1 with instability on flex/ext. He recommends follow up with Dr. Clois to discuss further treatment options.  - She has been off PLAVIX  for 6-8 months as she ran out of it. She has follow up with PCP next week. We discussed at her last visit that she should call and let them know. I had sent a message to PCP at that time as well.  - Refill of robaxin  sent to pharmacy. Reviewed dosing and side effects.  - Will plan follow up for cervical spine after her visit with Dr. Clois.    I spent a total of 35 minutes in face-to-face and non-face-to-face activities related to this patient's care today including review of outside records, review of imaging, review of symptoms, physical exam, discussion of differential diagnosis, discussion of treatment options, and documentation.     Glade Boys PA-C Dept. of Neurosurgery

## 2024-04-17 ENCOUNTER — Encounter: Payer: Self-pay | Admitting: Orthopedic Surgery

## 2024-04-17 ENCOUNTER — Ambulatory Visit: Admitting: Orthopedic Surgery

## 2024-04-17 VITALS — BP 106/82 | Ht 61.0 in | Wt 103.5 lb

## 2024-04-17 DIAGNOSIS — M5412 Radiculopathy, cervical region: Secondary | ICD-10-CM

## 2024-04-17 DIAGNOSIS — M40205 Unspecified kyphosis, thoracolumbar region: Secondary | ICD-10-CM

## 2024-04-17 DIAGNOSIS — M50221 Other cervical disc displacement at C4-C5 level: Secondary | ICD-10-CM | POA: Diagnosis not present

## 2024-04-17 DIAGNOSIS — M4726 Other spondylosis with radiculopathy, lumbar region: Secondary | ICD-10-CM

## 2024-04-17 DIAGNOSIS — M4802 Spinal stenosis, cervical region: Secondary | ICD-10-CM

## 2024-04-17 DIAGNOSIS — M4722 Other spondylosis with radiculopathy, cervical region: Secondary | ICD-10-CM | POA: Diagnosis not present

## 2024-04-17 DIAGNOSIS — M5416 Radiculopathy, lumbar region: Secondary | ICD-10-CM

## 2024-04-17 DIAGNOSIS — M5030 Other cervical disc degeneration, unspecified cervical region: Secondary | ICD-10-CM

## 2024-04-17 DIAGNOSIS — M50322 Other cervical disc degeneration at C5-C6 level: Secondary | ICD-10-CM

## 2024-04-17 DIAGNOSIS — M40209 Unspecified kyphosis, site unspecified: Secondary | ICD-10-CM

## 2024-04-17 DIAGNOSIS — Z8781 Personal history of (healed) traumatic fracture: Secondary | ICD-10-CM

## 2024-04-17 DIAGNOSIS — M5020 Other cervical disc displacement, unspecified cervical region: Secondary | ICD-10-CM

## 2024-04-17 DIAGNOSIS — M47812 Spondylosis without myelopathy or radiculopathy, cervical region: Secondary | ICD-10-CM

## 2024-04-17 DIAGNOSIS — M47816 Spondylosis without myelopathy or radiculopathy, lumbar region: Secondary | ICD-10-CM

## 2024-04-17 MED ORDER — METHOCARBAMOL 500 MG PO TABS: 500.0000 mg | ORAL_TABLET | Freq: Three times a day (TID) | ORAL | 0 refills | Status: AC | PRN

## 2024-04-17 NOTE — Patient Instructions (Signed)
 It was so nice to see you today. Thank you so much for coming in.    You have wear and tear in your neck with some disc bulges. This is likely causing your neck and arm pain.   I want you to see pain management here in [place] (Dr. Marcelino) to discuss possible cervical injections. They should call you to schedule an appointment or you can call them at [phone].   You have some increased movement in your back above your old compression fractures at L1 and L2. I think this is causing your back and possibly your leg pain.   I want you to see Dr. Clois to discuss further options for your lower back.   Please do not hesitate to call if you have any questions or concerns. You can also message me in MyChart.   Glade Boys PA-C [phone]     The physicians and staff at [facility] Neurosurgery at [facility] are committed to providing excellent care. You may receive a survey asking for feedback about your experience at our office. We value you your feedback and appreciate you taking the time to to fill it out. The [facility] leadership team is also available to discuss your experience in person, feel free to contact us  [phone].

## 2024-05-03 NOTE — Progress Notes (Deleted)
 Referring Physician:  Auston Reyes BIRCH, MD [address] [facility] [place],  [place] [address]  Primary Physician:  Auston Reyes BIRCH, MD  History of Present Illness: 04/17/2024 Notes from [place], [place] Ms. Caralynn Gelber has a history of chronic back pain, post-Covid syndrome, prediabetes, NSTEMI, CAD, COPD, emphysema, hyperlipidemia,    History of L1 kyphoplasty as well.    Last seen by me on 02/15/24 for neck and back pain.    She has constant neck pain with constant left arm pain to her hand and intermittent right arm pain to her hand.    She has known cervical spondylosis/DDD, worse at C5-C6.    She also has constant LBP with bilateral posterior leg pain  (entire leg) to her feet with weakness in both legs- they feel heavy. She has numbness and tingling in both legs.   She has known mild compression of L2 age indeterminate with history of L1 kyphoplasty, retrolisthesis L2-L3 and L3-L4, along with lumbar DDD and spondylosis. She has some increased kyphosis above fractures L1-L2.    MRI of her cervical and lumbar spine were ordered along with scoliosis xrays. She is here to review them.    She is the same. She has constant neck pain with constant left arm pain to her hand. She has intermittent right arm pain to her hand that is more constant now. She has constant numbness/tingling in left arm- feels asleep. She notes weakness in her left hand.    She has constant LBP with bilateral posterior leg pain  (entire leg) to her feet, left leg > right leg. She has weakness in both legs- they feel heavy. Pain is worse with stairs and prolonged walking. She has numbness and tingling in both legs.    No relief with naproxen , not sure if robaxin  helped.    She is still on PLAVIX , but ran out of it 6-8 months ago. Has appt with PCP on 04/25/24.    She smokes 1/2 PPD x 30 years. She is trying to quit.    Bowel/Bladder Dysfunction: none   Conservative measures:  Physical  therapy: PT at [facility] on 09/20/23- PT referred her back to us  due to severity of symptoms.  Multimodal medical therapy including regular antiinflammatories: tylenol , zanaflex, prednisone , naproxen  Injections: No epidural steroid injections   Past Surgery:  Had L1 kyphoplasty as well.  Hendricks BIRCH Larve has ***no symptoms of cervical myelopathy.  The symptoms are causing a significant impact on the patient's life.   I have utilized the care everywhere function in epic to review the outside records available from external health systems.  Review of Systems:  A 10 point review of systems is negative, except for the pertinent positives and negatives detailed in the HPI.  Past Medical History: Past Medical History:  Diagnosis Date   Anxiety    COPD (chronic obstructive pulmonary disease) (HCC)    Muscle spasm     Past Surgical History: Past Surgical History:  Procedure Laterality Date   BACK SURGERY     kyphoplasty   BACK SURGERY     CESAREAN SECTION     CORONARY STENT INTERVENTION N/A 07/28/2021   Procedure: CORONARY STENT INTERVENTION;  Surgeon: Wonda Sharper, MD;  Location: [facility] INVASIVE CV LAB;  Service: Cardiovascular;  Laterality: N/A;   LEFT HEART CATH AND CORONARY ANGIOGRAPHY N/A 07/28/2021   Procedure: LEFT HEART CATH AND CORONARY ANGIOGRAPHY;  Surgeon: Wonda Sharper, MD;  Location: [facility] INVASIVE CV LAB;  Service:  Cardiovascular;  Laterality: N/A;    Allergies: Allergies as of 05/09/2024   (No Known Allergies)    Medications:  Current Outpatient Medications:    acetaminophen  (TYLENOL ) 500 MG tablet, Take 1,000 mg by mouth every 6 (six) hours as needed for moderate pain., Disp: , Rfl:    atorvastatin  (LIPITOR ) 80 MG tablet, Take 80 mg by mouth daily., Disp: , Rfl:    busPIRone (BUSPAR) 10 MG tablet, Take 10 mg by mouth 2 (two) times daily as needed., Disp: , Rfl:    isosorbide  mononitrate (IMDUR ) 30 MG 24 hr tablet, Take 1 tablet (30 mg total) by mouth daily., Disp: 90  tablet, Rfl: 3   methocarbamol  (ROBAXIN ) 500 MG tablet, Take 1 tablet (500 mg total) by mouth every 8 (eight) hours as needed for muscle spasms., Disp: 60 tablet, Rfl: 0  Social History: Social History   Tobacco Use   Smoking status: Every Day    Current packs/day: 0.50    Types: Cigarettes   Smokeless tobacco: Never  Vaping Use   Vaping status: Some Days  Substance Use Topics   Alcohol use: No   Drug use: Yes    Types: Marijuana    Comment: occasionally for nausea and anxiety    Family Medical History: Family History  Problem Relation Age of Onset   Heart failure Mother    Heart disease Brother     Physical Examination: There were no vitals filed for this visit.  General: Patient is in no apparent distress. Attention to examination is appropriate.  Neck:   Supple.  Full range of motion.  Respiratory: Patient is breathing without any difficulty.   NEUROLOGICAL:     Awake, alert, oriented to person, place, and time.  Speech is clear and fluent.   Cranial Nerves: Pupils equal round and reactive to light.  Facial tone is symmetric.  Facial sensation is symmetric. Shoulder shrug is symmetric. Tongue protrusion is midline.  There is no pronator drift.  Strength: Side Biceps Triceps Deltoid Interossei Grip Wrist Ext. Wrist Flex.  R 5 5 5 5 5 5 5   L 5 5 5 5 5 5 5    Side Iliopsoas Quads Hamstring PF DF EHL  R 5 5 5 5 5 5   L 5 5 5 5 5 5    Reflexes are ***2+ and symmetric at the biceps, triceps, brachioradialis, patella and achilles.   Hoffman's is absent.   Bilateral upper and lower extremity sensation is intact to light touch.    No evidence of dysmetria noted.  Gait is normal.     Medical Decision Making  Imaging: ***  I have personally reviewed the images and agree with the above interpretation.  Assessment and Plan: Ms. Jillson is a pleasant 58 y.o. female with ***      Thank you for involving me in the care of this patient.      Chester K.  Clois MD, [facility] Neurosurgery

## 2024-05-09 ENCOUNTER — Ambulatory Visit: Admitting: Neurosurgery

## 2024-07-26 ENCOUNTER — Other Ambulatory Visit: Payer: Self-pay

## 2024-07-26 ENCOUNTER — Emergency Department

## 2024-07-26 ENCOUNTER — Inpatient Hospital Stay
Admission: EM | Admit: 2024-07-26 | Discharge: 2024-07-27 | DRG: 854 | Disposition: A | Discharge status: Home / Self Care | Attending: Student in an Organized Health Care Education/Training Program | Admitting: Student in an Organized Health Care Education/Training Program

## 2024-07-26 DIAGNOSIS — E872 Acidosis, unspecified: Secondary | ICD-10-CM | POA: Diagnosis present

## 2024-07-26 DIAGNOSIS — Z955 Presence of coronary angioplasty implant and graft: Secondary | ICD-10-CM

## 2024-07-26 DIAGNOSIS — N136 Pyonephrosis: Secondary | ICD-10-CM | POA: Diagnosis present

## 2024-07-26 DIAGNOSIS — E785 Hyperlipidemia, unspecified: Secondary | ICD-10-CM | POA: Diagnosis present

## 2024-07-26 DIAGNOSIS — Z87442 Personal history of urinary calculi: Secondary | ICD-10-CM

## 2024-07-26 DIAGNOSIS — I25118 Atherosclerotic heart disease of native coronary artery with other forms of angina pectoris: Secondary | ICD-10-CM | POA: Diagnosis present

## 2024-07-26 DIAGNOSIS — N131 Hydronephrosis with ureteral stricture, not elsewhere classified: Secondary | ICD-10-CM

## 2024-07-26 DIAGNOSIS — A419 Sepsis, unspecified organism: Principal | ICD-10-CM | POA: Diagnosis present

## 2024-07-26 DIAGNOSIS — N202 Calculus of kidney with calculus of ureter: Secondary | ICD-10-CM | POA: Diagnosis present

## 2024-07-26 DIAGNOSIS — F17210 Nicotine dependence, cigarettes, uncomplicated: Secondary | ICD-10-CM | POA: Diagnosis present

## 2024-07-26 DIAGNOSIS — Z720 Tobacco use: Secondary | ICD-10-CM | POA: Diagnosis present

## 2024-07-26 DIAGNOSIS — I2510 Atherosclerotic heart disease of native coronary artery without angina pectoris: Secondary | ICD-10-CM | POA: Diagnosis present

## 2024-07-26 DIAGNOSIS — J449 Chronic obstructive pulmonary disease, unspecified: Secondary | ICD-10-CM | POA: Diagnosis present

## 2024-07-26 DIAGNOSIS — Z8249 Family history of ischemic heart disease and other diseases of the circulatory system: Secondary | ICD-10-CM

## 2024-07-26 DIAGNOSIS — N200 Calculus of kidney: Secondary | ICD-10-CM

## 2024-07-26 DIAGNOSIS — I10 Essential (primary) hypertension: Secondary | ICD-10-CM | POA: Diagnosis present

## 2024-07-26 DIAGNOSIS — F419 Anxiety disorder, unspecified: Secondary | ICD-10-CM | POA: Diagnosis present

## 2024-07-26 DIAGNOSIS — Z79899 Other long term (current) drug therapy: Secondary | ICD-10-CM

## 2024-07-26 HISTORY — DX: Sepsis, unspecified organism: A41.9

## 2024-07-26 LAB — CBC
HCT: 41.2 % (ref 36.0–46.0)
Hemoglobin: 13.5 g/dL (ref 12.0–15.0)
MCH: 32.3 pg (ref 26.0–34.0)
MCHC: 32.8 g/dL (ref 30.0–36.0)
MCV: 98.6 fL (ref 80.0–100.0)
Platelets: 182 K/uL (ref 150–400)
RBC: 4.18 MIL/uL (ref 3.87–5.11)
RDW: 12.9 % (ref 11.5–15.5)
WBC: 16.4 K/uL — ABNORMAL HIGH (ref 4.0–10.5)
nRBC: 0 % (ref 0.0–0.2)

## 2024-07-26 LAB — URINALYSIS, ROUTINE W REFLEX MICROSCOPIC
Bilirubin Urine: NEGATIVE
Glucose, UA: NEGATIVE mg/dL
Ketones, ur: NEGATIVE mg/dL
Nitrite: POSITIVE — AB
Protein, ur: NEGATIVE mg/dL
Specific Gravity, Urine: 1.011 (ref 1.005–1.030)
WBC, UA: >50 WBC/hpf (ref 0–5)
pH: 6 (ref 5.0–8.0)

## 2024-07-26 LAB — BASIC METABOLIC PANEL WITH GFR
Anion gap: 12 (ref 5–15)
BUN: 11 mg/dL (ref 6–20)
CO2: 20 mmol/L — ABNORMAL LOW (ref 22–32)
Calcium: 8.7 mg/dL — ABNORMAL LOW (ref 8.9–10.3)
Chloride: 104 mmol/L (ref 98–111)
Creatinine, Ser: 1.04 mg/dL — ABNORMAL HIGH (ref 0.44–1.00)
GFR, Estimated: >60 mL/min (ref >60)
Glucose, Bld: 154 mg/dL — ABNORMAL HIGH (ref 70–99)
Potassium: 3.6 mmol/L (ref 3.5–5.1)
Sodium: 136 mmol/L (ref 135–145)

## 2024-07-26 LAB — LIPASE, BLOOD: Lipase: 24 U/L (ref 11–51)

## 2024-07-26 LAB — LACTIC ACID, PLASMA: Lactic Acid, Venous: 2.2 mmol/L (ref 0.5–1.9)

## 2024-07-26 MED ORDER — SODIUM CHLORIDE 0.9 % IV BOLUS
2000.0000 mL | Freq: Once | INTRAVENOUS | Status: AC
  Administered 2024-07-26: 2000 mL via INTRAVENOUS

## 2024-07-26 MED ORDER — SODIUM CHLORIDE 0.9 % IV BOLUS
1000.0000 mL | Freq: Once | INTRAVENOUS | Status: DC
Start: 2024-07-26 — End: 2024-07-26

## 2024-07-26 MED ORDER — SODIUM CHLORIDE 0.9 % IV SOLN: INTRAVENOUS | Status: DC

## 2024-07-26 MED ORDER — SODIUM CHLORIDE 0.9 % IV SOLN
2.0000 g | Freq: Once | INTRAVENOUS | Status: AC
  Administered 2024-07-27: 2 g via INTRAVENOUS
  Filled 2024-07-26: qty 20

## 2024-07-26 MED ORDER — ACETAMINOPHEN 500 MG PO TABS
1000.0000 mg | ORAL_TABLET | Freq: Once | ORAL | Status: AC
  Administered 2024-07-26: 1000 mg via ORAL
  Filled 2024-07-26: qty 2

## 2024-07-26 MED ORDER — ONDANSETRON 4 MG PO TBDP
4.0000 mg | ORAL_TABLET | Freq: Once | ORAL | Status: AC
  Administered 2024-07-26: 4 mg via ORAL
  Filled 2024-07-26: qty 1

## 2024-07-26 MED ORDER — MORPHINE SULFATE (PF) 4 MG/ML IV SOLN
4.0000 mg | Freq: Once | INTRAVENOUS | Status: AC
  Administered 2024-07-26: 4 mg via INTRAVENOUS
  Filled 2024-07-26: qty 1

## 2024-07-26 NOTE — ED Triage Notes (Signed)
 Patient C/O bilateral flank and left lower quadrant abdominal pain that began this afternoon and has worsened. Patient also endorses nausea, vomiting, and urinary urgency. She does have a history of kidney stones, but is unsure if this pain is similar.

## 2024-07-26 NOTE — ED Provider Notes (Incomplete)
 [facility] Provider Note    Event Date/Time   First MD Initiated Contact with Patient 07/26/24 2301     (approximate)   History   Flank Pain and Abdominal Pain   HPI  Tonya Miller is a 58 y.o. female who presents to the ED for evaluation of Flank Pain and Abdominal Pain   Reviewed PCP visit from 3 months ago.  History of CAD, HTN, HLD, COPD  Patient presents with her daughter-in-law for evaluation of feeling unwell over the past 24 hours.  Reports fever, urinary frequency and hesitancy without dysuria.  She reports a remote history of kidney stones but has never required procedures such as stenting or lithotripsy for kidney stones.   Physical Exam   Triage Vital Signs: ED Triage Vitals  Encounter Vitals Group     BP 07/26/24 2042 (!) 125/91     Girls Systolic BP Percentile --      Girls Diastolic BP Percentile --      Boys Systolic BP Percentile --      Boys Diastolic BP Percentile --      Pulse Rate 07/26/24 2042 94     Resp 07/26/24 2042 20     Temp 07/26/24 2042 (!) 101.2 F (38.4 C)     Temp Source 07/26/24 2042 Oral     SpO2 07/26/24 2042 100 %     Weight 07/26/24 2038 104 lb (47.2 kg)     Height 07/26/24 2038 5' (1.524 m)     Head Circumference --      Peak Flow --      Pain Score 07/26/24 2043 10     Pain Loc --      Pain Education --      Exclude from Growth Chart --     Most recent vital signs: Vitals:   07/26/24 2042  BP: (!) 125/91  Pulse: 94  Resp: 20  Temp: (!) 101.2 F (38.4 C)  SpO2: 100%    General: Awake, no distress. *** CV:  Good peripheral perfusion.  Resp:  Normal effort.  Abd:  No distention.  MSK:  No deformity noted.  Neuro:  No focal deficits appreciated. Other:     ED Results / Procedures / Treatments   Labs (all labs ordered are listed, but only abnormal results are displayed) Labs Reviewed  BASIC METABOLIC PANEL WITH GFR - Abnormal; Notable for the following components:      Result Value    CO2 20 (*)    Glucose, Bld 154 (*)    Creatinine, Ser 1.04 (*)    Calcium  8.7 (*)    All other components within normal limits  CBC - Abnormal; Notable for the following components:   WBC 16.4 (*)    All other components within normal limits  URINALYSIS, ROUTINE W REFLEX MICROSCOPIC - Abnormal; Notable for the following components:   Color, Urine YELLOW (*)    APPearance CLOUDY (*)    Hgb urine dipstick SMALL (*)    Nitrite POSITIVE (*)    Leukocytes,Ua LARGE (*)    Bacteria, UA MANY (*)    All other components within normal limits  LACTIC ACID, PLASMA - Abnormal; Notable for the following components:   Lactic Acid, Venous 2.2 (*)    All other components within normal limits  LIPASE, BLOOD    EKG ***  RADIOLOGY ***  Official radiology report(s): No results found.  PROCEDURES and INTERVENTIONS:  Procedures  Medications  cefTRIAXone  (ROCEPHIN ) 2  g in sodium chloride  0.9 % 100 mL IVPB (has no administration in time range)  sodium chloride  0.9 % bolus 1,000 mL (has no administration in time range)  acetaminophen  (TYLENOL ) tablet 1,000 mg (has no administration in time range)  ondansetron  (ZOFRAN -ODT) disintegrating tablet 4 mg (4 mg Oral Given 07/26/24 2050)     IMPRESSION / MDM / ASSESSMENT AND PLAN / ED COURSE  I reviewed the triage vital signs and the nursing notes.  Differential diagnosis includes, but is not limited to, ***  {Patient presents with symptoms of an acute illness or injury that is potentially life-threatening.}      FINAL CLINICAL IMPRESSION(S) / ED DIAGNOSES   Final diagnoses:  None     Rx / DC Orders   ED Discharge Orders     None        Note:  This document was prepared using Dragon voice recognition software and may include unintentional dictation errors.

## 2024-07-26 NOTE — ED Provider Notes (Signed)
 [facility] Provider Note    Event Date/Time   First MD Initiated Contact with Patient 07/26/24 2301     (approximate)   History   Flank Pain and Abdominal Pain   HPI  Tonya Miller is a 58 y.o. female who presents to the ED for evaluation of Flank Pain and Abdominal Pain   Reviewed PCP visit from 3 months ago.  History of CAD, HTN, HLD, COPD  Patient presents with her daughter-in-law for evaluation of feeling unwell over the past 24 hours.  Reports fever, urinary frequency and hesitancy without dysuria.  She reports a remote history of kidney stones but has never required procedures such as stenting or lithotripsy for kidney stones.   Physical Exam   Triage Vital Signs: ED Triage Vitals  Encounter Vitals Group     BP 07/26/24 2042 (!) 125/91     Girls Systolic BP Percentile --      Girls Diastolic BP Percentile --      Boys Systolic BP Percentile --      Boys Diastolic BP Percentile --      Pulse Rate 07/26/24 2042 94     Resp 07/26/24 2042 20     Temp 07/26/24 2042 (!) 101.2 F (38.4 C)     Temp Source 07/26/24 2042 Oral     SpO2 07/26/24 2042 100 %     Weight 07/26/24 2038 104 lb (47.2 kg)     Height 07/26/24 2038 5' (1.524 m)     Head Circumference --      Peak Flow --      Pain Score 07/26/24 2043 10     Pain Loc --      Pain Education --      Exclude from Growth Chart --     Most recent vital signs: Vitals:   07/26/24 2042  BP: (!) 125/91  Pulse: 94  Resp: 20  Temp: (!) 101.2 F (38.4 C)  SpO2: 100%    General: Awake, no distress.  Seems uncomfortable.   CV:  Good peripheral perfusion.  Resp:  Normal effort.  Abd:  No distention.  Suprapubic, left flank and left CVA tenderness. MSK:  No deformity noted.  Neuro:  No focal deficits appreciated. Other:     ED Results / Procedures / Treatments   Labs (all labs ordered are listed, but only abnormal results are displayed) Labs Reviewed  BASIC METABOLIC PANEL WITH GFR -  Abnormal; Notable for the following components:      Result Value   CO2 20 (*)    Glucose, Bld 154 (*)    Creatinine, Ser 1.04 (*)    Calcium  8.7 (*)    All other components within normal limits  CBC - Abnormal; Notable for the following components:   WBC 16.4 (*)    All other components within normal limits  URINALYSIS, ROUTINE W REFLEX MICROSCOPIC - Abnormal; Notable for the following components:   Color, Urine YELLOW (*)    APPearance CLOUDY (*)    Hgb urine dipstick SMALL (*)    Nitrite POSITIVE (*)    Leukocytes,Ua LARGE (*)    Bacteria, UA MANY (*)    All other components within normal limits  LACTIC ACID, PLASMA - Abnormal; Notable for the following components:   Lactic Acid, Venous 2.2 (*)    All other components within normal limits  URINE CULTURE  CULTURE, BLOOD (ROUTINE X 2)  CULTURE, BLOOD (ROUTINE X 2)  LIPASE, BLOOD  EKG   RADIOLOGY CT renal study interpreted by me with obstructing ureteral stones to left UPJ  Official radiology report(s): CT Renal Stone Study Result Date: 07/26/2024 CLINICAL DATA:  Sepsis. Urinary tract infection. Left flank pain. Nausea, vomiting, and urinary urgency. EXAM: CT ABDOMEN AND PELVIS WITHOUT CONTRAST TECHNIQUE: Multidetector CT imaging of the abdomen and pelvis was performed following the standard protocol without IV contrast. RADIATION DOSE REDUCTION: This exam was performed according to the departmental dose-optimization program which includes automated exposure control, adjustment of the mA and/or kV according to patient size and/or use of iterative reconstruction technique. COMPARISON:  10/06/2023 FINDINGS: Lower chest: Emphysematous changes in the lung bases. Infiltrates in the posterior lung bases likely representing dependent atelectasis. Hepatobiliary: No focal liver abnormality is seen. No gallstones, gallbladder wall thickening, or biliary dilatation. Pancreas: Unremarkable. No pancreatic ductal dilatation or surrounding  inflammatory changes. Spleen: Normal in size without focal abnormality. Adrenals/Urinary Tract: No adrenal gland nodules. Bilateral intrarenal calcifications consistent with stones. Largest on the right measures about 3 mm diameter. On the left, there is hydronephrosis and hydroureter with 2 stones, 5 mm and 3 mm diameter, demonstrated in the left ureteropelvic junction. There is mild stranding around the left kidney and ureter which could indicate pyelonephritis or postobstructive change. The bladder is decompressed with otherwise normal appearance. Stomach/Bowel: Stomach, small bowel, and colon are not abnormally distended. No wall thickening or inflammatory changes. Stool throughout the colon. Appendix is not identified. Vascular/Lymphatic: Aortic atherosclerosis. No enlarged abdominal or pelvic lymph nodes. Reproductive: Uterus and bilateral adnexa are unremarkable. Other: No free air or free fluid in the abdomen. Abdominal wall musculature appears intact. Musculoskeletal: Degenerative changes in the spine. Anterior compression of L1 and L2 vertebra with kyphoplasty at L1, unchanged. No acute bony abnormalities. IMPRESSION: 1. 3 mm and 5 mm stones demonstrated in the left ureteropelvic junction with moderate proximal obstruction. Infiltration around the left kidney and ureter likely represents postobstructive change but could indicate inflammatory process/pyelonephritis. 2. Bilateral nonobstructing intrarenal stones. 3. Emphysematous changes in the lung bases with dependent atelectasis. 4. Aortic atherosclerosis. Electronically Signed   By: Elsie Gravely M.D.   On: 07/26/2024 23:36    PROCEDURES and INTERVENTIONS:  .Critical Care  Performed by: Claudene Rover, MD Authorized by: Claudene Rover, MD   Critical care provider statement:    Critical care time (minutes):  30   Critical care time was exclusive of:  Separately billable procedures and treating other patients   Critical care was necessary to  treat or prevent imminent or life-threatening deterioration of the following conditions:  Sepsis   Critical care was time spent personally by me on the following activities:  Development of treatment plan with patient or surrogate, discussions with consultants, evaluation of patient's response to treatment, examination of patient, ordering and review of laboratory studies, ordering and review of radiographic studies, ordering and performing treatments and interventions, pulse oximetry, re-evaluation of patient's condition and review of old charts .1-3 Lead EKG Interpretation  Performed by: Claudene Rover, MD Authorized by: Claudene Rover, MD     Interpretation: normal     ECG rate:  90   ECG rate assessment: normal     Rhythm: sinus rhythm     Ectopy: none     Conduction: normal     Medications  cefTRIAXone  (ROCEPHIN ) 2 g in sodium chloride  0.9 % 100 mL IVPB (has no administration in time range)  0.9 %  sodium chloride  infusion (has no administration in time range)  ondansetron  (ZOFRAN -ODT)  disintegrating tablet 4 mg (4 mg Oral Given 07/26/24 2050)  acetaminophen  (TYLENOL ) tablet 1,000 mg (1,000 mg Oral Given 07/26/24 2343)  morphine  (PF) 4 MG/ML injection 4 mg (4 mg Intravenous Given 07/26/24 2338)  sodium chloride  0.9 % bolus 2,000 mL (2,000 mLs Intravenous New Bag/Given 07/26/24 2337)     IMPRESSION / MDM / ASSESSMENT AND PLAN / ED COURSE  I reviewed the triage vital signs and the nursing notes.  Differential diagnosis includes, but is not limited to, sepsis, pyelonephritis, UTI, ureteral stone, shingles  {Patient presents with symptoms of an acute illness or injury that is potentially life-threatening.  Patient presents with signs of sepsis from UTI with coexisting obstructing ureteral stone requiring urologic stenting and medical admission.  Febrile but no signs of shock or instability.  Leukocytosis and mild lactic acidosis are noted.  Marginal worsening of renal dysfunction, but  near baseline.  Urine with infectious features, sent for culture alongside blood cultures.  CT with obstructing ureteral stones so consult urology who recommends stenting tonight.  Consult medicine for admission  Clinical Course as of 07/27/24 0005  Fri Jul 26, 2024  2345 Update patient of CT results.  Have urology paged [DS]  2355 I consult with Dr. Renda, Urology. Agrees ureteral stenting . Will work on contacting OR, asks I admit to medicine.  [DS]    Clinical Course User Index [DS] Claudene Rover, MD     FINAL CLINICAL IMPRESSION(S) / ED DIAGNOSES   Final diagnoses:  Sepsis due to urinary tract infection (HCC)     Rx / DC Orders   ED Discharge Orders     None        Note:  This document was prepared using Dragon voice recognition software and may include unintentional dictation errors.   Claudene Rover, MD 07/27/24 [phone]

## 2024-07-26 NOTE — ED Notes (Signed)
 Lactic 2.2 lab called with critical result

## 2024-07-27 ENCOUNTER — Inpatient Hospital Stay

## 2024-07-27 ENCOUNTER — Inpatient Hospital Stay: Admitting: Certified Registered"

## 2024-07-27 ENCOUNTER — Encounter
Admission: EM | Disposition: A | Discharge status: Home / Self Care | Payer: Self-pay | Source: Home / Self Care | Attending: Student in an Organized Health Care Education/Training Program

## 2024-07-27 DIAGNOSIS — E872 Acidosis, unspecified: Secondary | ICD-10-CM | POA: Diagnosis present

## 2024-07-27 DIAGNOSIS — N131 Hydronephrosis with ureteral stricture, not elsewhere classified: Secondary | ICD-10-CM

## 2024-07-27 DIAGNOSIS — Z79899 Other long term (current) drug therapy: Secondary | ICD-10-CM | POA: Diagnosis not present

## 2024-07-27 DIAGNOSIS — N390 Urinary tract infection, site not specified: Secondary | ICD-10-CM | POA: Diagnosis not present

## 2024-07-27 DIAGNOSIS — Z955 Presence of coronary angioplasty implant and graft: Secondary | ICD-10-CM | POA: Diagnosis not present

## 2024-07-27 DIAGNOSIS — N200 Calculus of kidney: Secondary | ICD-10-CM | POA: Diagnosis not present

## 2024-07-27 DIAGNOSIS — N202 Calculus of kidney with calculus of ureter: Secondary | ICD-10-CM | POA: Diagnosis present

## 2024-07-27 DIAGNOSIS — J449 Chronic obstructive pulmonary disease, unspecified: Secondary | ICD-10-CM | POA: Diagnosis present

## 2024-07-27 DIAGNOSIS — Z8249 Family history of ischemic heart disease and other diseases of the circulatory system: Secondary | ICD-10-CM | POA: Diagnosis not present

## 2024-07-27 DIAGNOSIS — I2510 Atherosclerotic heart disease of native coronary artery without angina pectoris: Secondary | ICD-10-CM | POA: Diagnosis present

## 2024-07-27 DIAGNOSIS — F419 Anxiety disorder, unspecified: Secondary | ICD-10-CM | POA: Diagnosis present

## 2024-07-27 DIAGNOSIS — E785 Hyperlipidemia, unspecified: Secondary | ICD-10-CM | POA: Diagnosis present

## 2024-07-27 DIAGNOSIS — Z87442 Personal history of urinary calculi: Secondary | ICD-10-CM | POA: Diagnosis not present

## 2024-07-27 DIAGNOSIS — I10 Essential (primary) hypertension: Secondary | ICD-10-CM | POA: Diagnosis present

## 2024-07-27 DIAGNOSIS — N136 Pyonephrosis: Secondary | ICD-10-CM | POA: Diagnosis present

## 2024-07-27 DIAGNOSIS — A419 Sepsis, unspecified organism: Secondary | ICD-10-CM | POA: Diagnosis present

## 2024-07-27 DIAGNOSIS — F17210 Nicotine dependence, cigarettes, uncomplicated: Secondary | ICD-10-CM | POA: Diagnosis present

## 2024-07-27 HISTORY — PX: CYSTOSCOPY WITH STENT PLACEMENT: SHX5790

## 2024-07-27 LAB — CBC
HCT: 35.8 % — ABNORMAL LOW (ref 36.0–46.0)
Hemoglobin: 11.7 g/dL — ABNORMAL LOW (ref 12.0–15.0)
MCH: 32.1 pg (ref 26.0–34.0)
MCHC: 32.7 g/dL (ref 30.0–36.0)
MCV: 98.4 fL (ref 80.0–100.0)
Platelets: 147 K/uL — ABNORMAL LOW (ref 150–400)
RBC: 3.64 MIL/uL — ABNORMAL LOW (ref 3.87–5.11)
RDW: 13 % (ref 11.5–15.5)
WBC: 13.5 K/uL — ABNORMAL HIGH (ref 4.0–10.5)
nRBC: 0 % (ref 0.0–0.2)

## 2024-07-27 LAB — PROTIME-INR
INR: 1.1 (ref 0.8–1.2)
Prothrombin Time: 15.1 s (ref 11.4–15.2)

## 2024-07-27 LAB — COMPREHENSIVE METABOLIC PANEL WITH GFR
ALT: 8 U/L (ref 0–44)
AST: 20 U/L (ref 15–41)
Albumin: 2.9 g/dL — ABNORMAL LOW (ref 3.5–5.0)
Alkaline Phosphatase: 64 U/L (ref 38–126)
Anion gap: 6 (ref 5–15)
BUN: 9 mg/dL (ref 6–20)
CO2: 23 mmol/L (ref 22–32)
Calcium: 7.7 mg/dL — ABNORMAL LOW (ref 8.9–10.3)
Chloride: 113 mmol/L — ABNORMAL HIGH (ref 98–111)
Creatinine, Ser: 0.75 mg/dL (ref 0.44–1.00)
GFR, Estimated: >60 mL/min (ref >60)
Glucose, Bld: 136 mg/dL — ABNORMAL HIGH (ref 70–99)
Potassium: 3.9 mmol/L (ref 3.5–5.1)
Sodium: 142 mmol/L (ref 135–145)
Total Bilirubin: 0.4 mg/dL (ref 0.0–1.2)
Total Protein: 6 g/dL — ABNORMAL LOW (ref 6.5–8.1)

## 2024-07-27 LAB — CORTISOL-AM, BLOOD: Cortisol - AM: 3.4 ug/dL — ABNORMAL LOW (ref 6.7–22.6)

## 2024-07-27 SURGERY — CYSTOSCOPY, WITH STENT INSERTION
Anesthesia: General | Laterality: Left

## 2024-07-27 MED ORDER — GLYCOPYRROLATE 0.2 MG/ML IJ SOLN
INTRAMUSCULAR | Status: DC | PRN
  Administered 2024-07-27: .2 mg via INTRAVENOUS

## 2024-07-27 MED ORDER — OXYCODONE HCL 5 MG PO TABS: 5.0000 mg | ORAL_TABLET | Freq: Once | ORAL | Status: DC | PRN

## 2024-07-27 MED ORDER — PHENYLEPHRINE HCL (PRESSORS) 10 MG/ML IV SOLN
INTRAVENOUS | Status: DC | PRN
  Administered 2024-07-27 (×2): 160 ug via INTRAVENOUS
  Administered 2024-07-27: 80 ug via INTRAVENOUS

## 2024-07-27 MED ORDER — PHENYLEPHRINE 80 MCG/ML (10ML) SYRINGE FOR IV PUSH (FOR BLOOD PRESSURE SUPPORT)
PREFILLED_SYRINGE | INTRAVENOUS | Status: AC
  Filled 2024-07-27: qty 10

## 2024-07-27 MED ORDER — GLYCOPYRROLATE 0.2 MG/ML IJ SOLN
INTRAMUSCULAR | Status: AC
  Filled 2024-07-27: qty 1

## 2024-07-27 MED ORDER — ENOXAPARIN SODIUM 40 MG/0.4ML IJ SOSY
40.0000 mg | PREFILLED_SYRINGE | INTRAMUSCULAR | Status: DC
  Administered 2024-07-27: 40 mg via SUBCUTANEOUS
  Filled 2024-07-27: qty 0.4

## 2024-07-27 MED ORDER — ACETAMINOPHEN 500 MG PO TABS
1000.0000 mg | ORAL_TABLET | Freq: Four times a day (QID) | ORAL | Status: DC | PRN
Start: 2024-07-27 — End: 2024-07-27
  Administered 2024-07-27: 1000 mg via ORAL
  Filled 2024-07-27: qty 2

## 2024-07-27 MED ORDER — DEXAMETHASONE SOD PHOSPHATE PF 10 MG/ML IJ SOLN
INTRAMUSCULAR | Status: DC | PRN
  Administered 2024-07-27: 10 mg via INTRAVENOUS

## 2024-07-27 MED ORDER — FENTANYL CITRATE (PF) 100 MCG/2ML IJ SOLN
INTRAMUSCULAR | Status: DC | PRN
  Administered 2024-07-27 (×2): 50 ug via INTRAVENOUS

## 2024-07-27 MED ORDER — ISOSORBIDE MONONITRATE ER 30 MG PO TB24
30.0000 mg | ORAL_TABLET | Freq: Every day | ORAL | Status: DC
Start: 2024-07-27 — End: 2024-07-27
  Administered 2024-07-27: 30 mg via ORAL
  Filled 2024-07-27: qty 1

## 2024-07-27 MED ORDER — PROPOFOL 10 MG/ML IV BOLUS
INTRAVENOUS | Status: DC | PRN
  Administered 2024-07-27: 100 mg via INTRAVENOUS

## 2024-07-27 MED ORDER — EPHEDRINE SULFATE (PRESSORS) 50 MG/ML IJ SOLN
INTRAMUSCULAR | Status: DC | PRN
  Administered 2024-07-27: 10 mg via INTRAVENOUS

## 2024-07-27 MED ORDER — ONDANSETRON HCL 4 MG/2ML IJ SOLN
INTRAMUSCULAR | Status: AC
  Filled 2024-07-27: qty 2

## 2024-07-27 MED ORDER — SODIUM CHLORIDE 0.9 % IR SOLN
Status: DC | PRN
  Administered 2024-07-27: 200 mL

## 2024-07-27 MED ORDER — ONDANSETRON HCL 4 MG/2ML IJ SOLN: 4.0000 mg | Freq: Four times a day (QID) | INTRAMUSCULAR | Status: DC | PRN

## 2024-07-27 MED ORDER — FENTANYL CITRATE (PF) 100 MCG/2ML IJ SOLN
INTRAMUSCULAR | Status: AC
  Filled 2024-07-27: qty 2

## 2024-07-27 MED ORDER — ONDANSETRON HCL 4 MG/2ML IJ SOLN
INTRAMUSCULAR | Status: DC | PRN
Start: 2024-07-27 — End: 2024-07-27
  Administered 2024-07-27: 4 mg via INTRAVENOUS

## 2024-07-27 MED ORDER — LACTATED RINGERS IV SOLN
150.0000 mL/h | INTRAVENOUS | Status: DC
  Administered 2024-07-27: 150 mL/h via INTRAVENOUS

## 2024-07-27 MED ORDER — ATORVASTATIN CALCIUM 80 MG PO TABS
80.0000 mg | ORAL_TABLET | Freq: Every day | ORAL | Status: DC
  Administered 2024-07-27: 80 mg via ORAL
  Filled 2024-07-27: qty 1

## 2024-07-27 MED ORDER — AMOXICILLIN-POT CLAVULANATE 875-125 MG PO TABS: 1.0000 | ORAL_TABLET | Freq: Two times a day (BID) | ORAL | 0 refills | Status: DC

## 2024-07-27 MED ORDER — FENTANYL CITRATE (PF) 100 MCG/2ML IJ SOLN: 25.0000 ug | INTRAMUSCULAR | Status: DC | PRN

## 2024-07-27 MED ORDER — LIDOCAINE HCL (CARDIAC) PF 100 MG/5ML IV SOSY
PREFILLED_SYRINGE | INTRAVENOUS | Status: DC | PRN
  Administered 2024-07-27: 50 mg via INTRAVENOUS

## 2024-07-27 MED ORDER — SUCCINYLCHOLINE CHLORIDE 200 MG/10ML IV SOSY
PREFILLED_SYRINGE | INTRAVENOUS | Status: DC | PRN
  Administered 2024-07-27: 80 mg via INTRAVENOUS

## 2024-07-27 MED ORDER — OXYCODONE HCL 5 MG/5ML PO SOLN: 5.0000 mg | Freq: Once | ORAL | Status: DC | PRN

## 2024-07-27 MED ORDER — SUCCINYLCHOLINE CHLORIDE 200 MG/10ML IV SOSY
PREFILLED_SYRINGE | INTRAVENOUS | Status: AC
  Filled 2024-07-27: qty 10

## 2024-07-27 MED ORDER — EPHEDRINE SULFATE (PRESSORS) 50 MG/ML IJ SOLN
INTRAMUSCULAR | Status: AC
  Filled 2024-07-27: qty 1

## 2024-07-27 MED ORDER — METHOCARBAMOL 500 MG PO TABS
500.0000 mg | ORAL_TABLET | Freq: Three times a day (TID) | ORAL | Status: DC | PRN
  Administered 2024-07-27: 500 mg via ORAL
  Filled 2024-07-27: qty 1

## 2024-07-27 MED ORDER — BUSPIRONE HCL 10 MG PO TABS: 10.0000 mg | ORAL_TABLET | Freq: Two times a day (BID) | ORAL | Status: DC | PRN

## 2024-07-27 MED ORDER — LIDOCAINE HCL (PF) 2 % IJ SOLN
INTRAMUSCULAR | Status: AC
  Filled 2024-07-27: qty 5

## 2024-07-27 MED ORDER — PROPOFOL 10 MG/ML IV BOLUS
INTRAVENOUS | Status: AC
  Filled 2024-07-27: qty 20

## 2024-07-27 MED ORDER — ONDANSETRON HCL 4 MG PO TABS: 4.0000 mg | ORAL_TABLET | Freq: Four times a day (QID) | ORAL | Status: DC | PRN

## 2024-07-27 MED ORDER — SODIUM CHLORIDE 0.9 % IV SOLN: 2.0000 g | INTRAVENOUS | Status: DC

## 2024-07-27 SURGICAL SUPPLY — 17 items
BAG DRAIN SIEMENS DORNER NS (MISCELLANEOUS) ×1 IMPLANT
BRUSH SCRUB EZ 4% CHG (MISCELLANEOUS) ×1 IMPLANT
CATH URETL OPEN END 6X70 (CATHETERS) ×1 IMPLANT
CNTNR URN SCR LID CUP LEK RST (MISCELLANEOUS) IMPLANT
GLOVE BIOGEL PI IND STRL 7.5 (GLOVE) ×1 IMPLANT
GOWN STRL REUS W/ TWL XL LVL3 (GOWN DISPOSABLE) ×1 IMPLANT
GUIDEWIRE STR DUAL SENSOR (WIRE) ×1 IMPLANT
KIT TURNOVER CYSTO (KITS) ×1 IMPLANT
PACK CYSTO AR (MISCELLANEOUS) ×1 IMPLANT
SET CYSTO IRRIGATION (SET/KITS/TRAYS/PACK) ×1 IMPLANT
SOL .9 NS 3000ML IRR UROMATIC (IV SOLUTION) ×1 IMPLANT
SOLN STERILE WATER 1000 ML (IV SOLUTION) ×1 IMPLANT
SOLN STERILE WATER BTL 1000 ML (IV SOLUTION) ×1 IMPLANT
STENT URET 6FRX24 CONTOUR (STENTS) IMPLANT
STENT URET 6FRX26 CONTOUR (STENTS) IMPLANT
SURGILUBE 2OZ TUBE FLIPTOP (MISCELLANEOUS) ×1 IMPLANT
WATER STERILE IRR 500ML POUR (IV SOLUTION) ×1 IMPLANT

## 2024-07-27 NOTE — Consult Note (Signed)
 Urology Consult   Physician requesting consult:  Dr. Ester Sharps  Reason for consult: Left ureteral stones and fever/sepsis  History of Present Illness: Tonya Miller is a 58 y.o. who developed left flank pain approximately 24 hours ago.  She subsequently developed increasing pain associated with fever up to 102 degrees.  She therefore presented to the emergency department and a CT scan revealed a 3 mm and 5 mm obstructing left ureteral calculus.  Her urinalysis indicated evidence of infection.  She has thus far been hemodynamically stable.  Her lactic acid is slightly elevated.  She does take Plavix  with her last dose having been yesterday morning.  She has undergone 2 cardiac stents in 2022.   Past Medical History:  Diagnosis Date   Anxiety    COPD (chronic obstructive pulmonary disease) (HCC)    Muscle spasm   Coronary artery disease  Past Surgical History:  Procedure Laterality Date   Miller SURGERY     kyphoplasty   Miller SURGERY     CESAREAN SECTION     CORONARY STENT INTERVENTION N/A 07/28/2021   Procedure: CORONARY STENT INTERVENTION;  Surgeon: Wonda Sharper, MD;  Location: [facility] INVASIVE CV LAB;  Service: Cardiovascular;  Laterality: N/A;   LEFT HEART CATH AND CORONARY ANGIOGRAPHY N/A 07/28/2021   Procedure: LEFT HEART CATH AND CORONARY ANGIOGRAPHY;  Surgeon: Wonda Sharper, MD;  Location: [facility] INVASIVE CV LAB;  Service: Cardiovascular;  Laterality: N/A;     Current Hospital Medications:  Home meds:  No current facility-administered medications on file prior to encounter.   Current Outpatient Medications on File Prior to Encounter  Medication Sig Dispense Refill   acetaminophen  (TYLENOL ) 500 MG tablet Take 1,000 mg by mouth every 6 (six) hours as needed for moderate pain.     atorvastatin  (LIPITOR ) 80 MG tablet Take 80 mg by mouth daily.     busPIRone (BUSPAR) 10 MG tablet Take 10 mg by mouth 2 (two) times daily as needed.     isosorbide  mononitrate (IMDUR ) 30 MG 24 hr  tablet Take 1 tablet (30 mg total) by mouth daily. 90 tablet 3   methocarbamol  (ROBAXIN ) 500 MG tablet Take 1 tablet (500 mg total) by mouth every 8 (eight) hours as needed for muscle spasms. 60 tablet 0  Plavix   Scheduled Meds: Continuous Infusions:  sodium chloride      cefTRIAXone  (ROCEPHIN )  IV     PRN Meds:.  Allergies: No Known Allergies  Family History  Problem Relation Age of Onset   Heart failure Mother    Heart disease Brother     Social History:  reports that she has been smoking cigarettes. She has never used smokeless tobacco. She reports current drug use. Drug: Marijuana. She reports that she does not drink alcohol.  ROS: A complete review of systems was performed.  All systems are negative except for pertinent findings as noted.  Physical Exam:  Vital signs in last 24 hours: Temp:  [101.2 F (38.4 C)] 101.2 F (38.4 C) (10/10 2042) Pulse Rate:  [94] 94 (10/10 2042) Resp:  [20] 20 (10/10 2042) BP: (125)/(91) 125/91 (10/10 2042) SpO2:  [100 %] 100 % (10/10 2042) Weight:  [47.2 kg] 47.2 kg (10/10 2038) Constitutional:  Alert and oriented, No acute distress Cardiovascular: No JVD Respiratory: Normal respiratory effort GI: Left abdominal tenderness GU: Right left CVA tenderness Lymphatic: No lymphadenopathy Neurologic: Grossly intact, no focal deficits Psychiatric: Normal mood and affect  Laboratory Data:  Recent Labs    07/26/24 2045  WBC  16.4*  HGB 13.5  HCT 41.2  PLT 182    Recent Labs    07/26/24 2045  NA 136  K 3.6  CL 104  GLUCOSE 154*  BUN 11  CALCIUM  8.7*  CREATININE 1.04*     Results for orders placed or performed during the hospital encounter of 07/26/24 (from the past 24 hours)  Urinalysis, Routine w reflex microscopic -Urine, Clean Catch     Status: Abnormal   Collection Time: 07/26/24  8:44 PM  Result Value Ref Range   Color, Urine YELLOW (A) YELLOW   APPearance CLOUDY (A) CLEAR   Specific Gravity, Urine 1.011 1.005 - 1.030    pH 6.0 5.0 - 8.0   Glucose, UA NEGATIVE NEGATIVE mg/dL   Hgb urine dipstick SMALL (A) NEGATIVE   Bilirubin Urine NEGATIVE NEGATIVE   Ketones, ur NEGATIVE NEGATIVE mg/dL   Protein, ur NEGATIVE NEGATIVE mg/dL   Nitrite POSITIVE (A) NEGATIVE   Leukocytes,Ua LARGE (A) NEGATIVE   RBC / HPF 11-20 0 - 5 RBC/hpf   WBC, UA >50 0 - 5 WBC/hpf   Bacteria, UA MANY (A) NONE SEEN   Squamous Epithelial / HPF 0-5 0 - 5 /HPF   Mucus PRESENT   Basic metabolic panel     Status: Abnormal   Collection Time: 07/26/24  8:45 PM  Result Value Ref Range   Sodium 136 135 - 145 mmol/L   Potassium 3.6 3.5 - 5.1 mmol/L   Chloride 104 98 - 111 mmol/L   CO2 20 (L) 22 - 32 mmol/L   Glucose, Bld 154 (H) 70 - 99 mg/dL   BUN 11 6 - 20 mg/dL   Creatinine, Ser 8.95 (H) 0.44 - 1.00 mg/dL   Calcium  8.7 (L) 8.9 - 10.3 mg/dL   GFR, Estimated >39 >39 mL/min   Anion gap 12 5 - 15  CBC     Status: Abnormal   Collection Time: 07/26/24  8:45 PM  Result Value Ref Range   WBC 16.4 (H) 4.0 - 10.5 K/uL   RBC 4.18 3.87 - 5.11 MIL/uL   Hemoglobin 13.5 12.0 - 15.0 g/dL   HCT 58.7 63.9 - 53.9 %   MCV 98.6 80.0 - 100.0 fL   MCH 32.3 26.0 - 34.0 pg   MCHC 32.8 30.0 - 36.0 g/dL   RDW 87.0 88.4 - 84.4 %   Platelets 182 150 - 400 K/uL   nRBC 0.0 0.0 - 0.2 %  Lipase, blood     Status: None   Collection Time: 07/26/24  8:45 PM  Result Value Ref Range   Lipase 24 11 - 51 U/L  Lactic acid, plasma     Status: Abnormal   Collection Time: 07/26/24  8:45 PM  Result Value Ref Range   Lactic Acid, Venous 2.2 (HH) 0.5 - 1.9 mmol/L   No results found for this or any previous visit (from the past 240 hours).  Renal Function: Recent Labs    07/26/24 2045  CREATININE 1.04*   Estimated Creatinine Clearance: 42.4 mL/min (A) (by C-G formula based on SCr of 1.04 mg/dL (H)).  Radiologic Imaging: CT Renal Stone Study Result Date: 07/26/2024 CLINICAL DATA:  Sepsis. Urinary tract infection. Left flank pain. Nausea, vomiting, and  urinary urgency. EXAM: CT ABDOMEN AND PELVIS WITHOUT CONTRAST TECHNIQUE: Multidetector CT imaging of the abdomen and pelvis was performed following the standard protocol without IV contrast. RADIATION DOSE REDUCTION: This exam was performed according to the departmental dose-optimization program which includes automated exposure control,  adjustment of the mA and/or kV according to patient size and/or use of iterative reconstruction technique. COMPARISON:  10/06/2023 FINDINGS: Lower chest: Emphysematous changes in the lung bases. Infiltrates in the posterior lung bases likely representing dependent atelectasis. Hepatobiliary: No focal liver abnormality is seen. No gallstones, gallbladder wall thickening, or biliary dilatation. Pancreas: Unremarkable. No pancreatic ductal dilatation or surrounding inflammatory changes. Spleen: Normal in size without focal abnormality. Adrenals/Urinary Tract: No adrenal gland nodules. Bilateral intrarenal calcifications consistent with stones. Largest on the right measures about 3 mm diameter. On the left, there is hydronephrosis and hydroureter with 2 stones, 5 mm and 3 mm diameter, demonstrated in the left ureteropelvic junction. There is mild stranding around the left kidney and ureter which could indicate pyelonephritis or postobstructive change. The bladder is decompressed with otherwise normal appearance. Stomach/Bowel: Stomach, small bowel, and colon are not abnormally distended. No wall thickening or inflammatory changes. Stool throughout the colon. Appendix is not identified. Vascular/Lymphatic: Aortic atherosclerosis. No enlarged abdominal or pelvic lymph nodes. Reproductive: Uterus and bilateral adnexa are unremarkable. Other: No free air or free fluid in the abdomen. Abdominal wall musculature appears intact. Musculoskeletal: Degenerative changes in the spine. Anterior compression of L1 and L2 vertebra with kyphoplasty at L1, unchanged. No acute bony abnormalities.  IMPRESSION: 1. 3 mm and 5 mm stones demonstrated in the left ureteropelvic junction with moderate proximal obstruction. Infiltration around the left kidney and ureter likely represents postobstructive change but could indicate inflammatory process/pyelonephritis. 2. Bilateral nonobstructing intrarenal stones. 3. Emphysematous changes in the lung bases with dependent atelectasis. 4. Aortic atherosclerosis. Electronically Signed   By: Elsie Gravely M.D.   On: 07/26/2024 23:36    I independently reviewed the above imaging studies.  Impression/Recommendation: Left ureteral calculi with fever/UTI: We discussed her situation in detail.  I have recommended that she proceed with urgent cystoscopy and left ureteral stent placement due to her obstructing left ureteral stones and systemic signs of infection/developing sepsis.  We reviewed the procedure in detail including potential risks, complications, and expected recovery process.  She gives informed consent to proceed.  She will then plan to be admitted to the hospital for ongoing IV antibiotics pending final culture results.  She understands that she will need definitive stone treatment once her infection has been adequately treated.22  Les Amarisa Wilinski 07/27/2024, 12:01 AM  Gretel CANDIE Renda Mickey. MD   CC: Dr. Ester Sharps

## 2024-07-27 NOTE — Plan of Care (Signed)

## 2024-07-27 NOTE — Op Note (Signed)
 Preoperative diagnosis:  Left ureteral calculi and fever/UTI   Postoperative diagnosis:  Left ureteral calculi and fever/UTI   Procedure:  Cystoscopy Left ureteral stent placement (6 x24 - no string)   Surgeon: Gretel CANDIE Ferrara, Mickey. M.D.  Anesthesia: General  Complications: None  EBL: Minimal  Specimens: Urine culture  Indication: Tonya Miller is a 58 y.o. patient with two left ureteral calculi and fever with developing sepsis. After reviewing the management options for treatment, he elected to proceed with the above surgical procedure(s). We have discussed the potential benefits and risks of the procedure, side effects of the proposed treatment, the likelihood of the patient achieving the goals of the procedure, and any potential problems that might occur during the procedure or recuperation. Informed consent has been obtained.  Description of procedure:  The patient was taken to the operating room and general anesthesia was induced.  The patient was placed in the dorsal lithotomy position, prepped and draped in the usual sterile fashion, and preoperative antibiotics were administered. A preoperative time-out was performed.   Cystourethroscopy was performed.  The patient's urethra was examined and was normal. The bladder was then systematically examined in its entirety. There was no evidence for any bladder tumors, stones, or other mucosal pathology.    Attention then turned to the left ureteral orifice. A 0.38 sensor guidewire was then advanced up the left ureter into the renal pelvis under fluoroscopic guidance.  The wire was then backloaded through the cystoscope and a ureteral stent was advance over the wire using Seldinger technique.  The stent was positioned appropriately under fluoroscopic and cystoscopic guidance.  The wire was then removed with an adequate stent curl noted in the renal pelvis as well as in the bladder.  The bladder was then emptied and the procedure ended.   The patient appeared to tolerate the procedure well and without complications.  The patient was able to be awakened and transferred to the recovery unit in satisfactory condition.    Gretel CANDIE Ferrara Teddie MD

## 2024-07-27 NOTE — Progress Notes (Signed)
 Pt being followed by [facility] for Sepsis protocol.

## 2024-07-27 NOTE — Anesthesia Procedure Notes (Signed)
 Procedure Name: Intubation Date/Time: 07/27/2024 1:17 AM  Performed by: Leontine Katz, CRNAPre-anesthesia Checklist: Patient identified, Patient being monitored, Timeout performed, Emergency Drugs available and Suction available Patient Re-evaluated:Patient Re-evaluated prior to induction Oxygen Delivery Method: Circle system utilized Preoxygenation: Pre-oxygenation with 100% oxygen Induction Type: IV induction and Rapid sequence Laryngoscope Size: 3 and McGrath Grade View: Grade I Tube type: Oral Tube size: 7.0 mm Number of attempts: 1 Airway Equipment and Method: Stylet and Video-laryngoscopy Placement Confirmation: ETT inserted through vocal cords under direct vision, positive ETCO2 and breath sounds checked- equal and bilateral Secured at: 20 cm Tube secured with: Tape Dental Injury: Teeth and Oropharynx as per pre-operative assessment

## 2024-07-27 NOTE — Discharge Summary (Signed)
 Physician Discharge Summary  Patient: Tonya Miller FMW:[id number] DOB: [birth year]   Code Status: Full Code Admit date: 07/26/2024 Discharge date: 07/27/2024 Disposition: Home, No home health services recommended PCP: Auston Reyes [name], MD  Recommendations for Outpatient Follow-up:  Follow up with PCP within 1-2 weeks Regarding general hospital follow up and preventative care Recommend BP recheck and discuss if IMDUR  needs to be restarted or not.  Follow up with urology in 2 weeks   Discharge Diagnoses:  Principal Problem:   Sepsis secondary to UTI [facility]) Active Problems:   Coronary artery disease of native artery of native heart with stable angina pectoris   COPD (chronic obstructive pulmonary disease) (HCC)   Tobacco abuse   Hyperlipidemia   Nephrolithiasis   Hydronephrosis with ureteral stricture, not elsewhere classified  Brief Hospital Course Summary: Tonya Miller is a 58 y.o. female with a PMH significant for COPD,Essential hypertension, anxiety disorder, hyperlipidemia, prior history of subarachnoid hemorrhage, tobacco use. They presented to the ER with onset of Bilateral flank and left lower quadrant abdominal pain. She met sepsis criteria with fever, leukocytosis, lactic acidosis with lactic acid 2.2.  Urinalysis consistent with UTI.  CT renal study showed 3 mm and 5 mm stones in the left ureteropelvic junction with some evidence of acute pyelonephritis.    She was started on IV Abx and urology was consulted.  She underwent cystoscopy and left ureteral stent placement on 10/11.  She tolerated the procedure well and on day after the procedure was urinating without pain and tolerating a normal diet. She was discharged with oral pain medication and Augmentin for 2 weeks per urology recommendations.  All other chronic conditions were treated with home medications.    Discharge Condition: Good, improved Recommended discharge diet: Regular healthy  diet  Consultations: Urology   Procedures/Studies: Cystoscopy with left ureteral stent placement.   Allergies as of 07/27/2024   No Known Allergies      Medication List     STOP taking these medications    isosorbide  mononitrate 30 MG 24 hr tablet Commonly known as: IMDUR        TAKE these medications    acetaminophen  500 MG tablet Commonly known as: TYLENOL  Take 1,000 mg by mouth every 6 (six) hours as needed for moderate pain.   amoxicillin -clavulanate 875-125 MG tablet Commonly known as: AUGMENTIN Take 1 tablet by mouth 2 (two) times daily for 14 days.   atorvastatin  80 MG tablet Commonly known as: LIPITOR  Take 80 mg by mouth daily.   busPIRone 10 MG tablet Commonly known as: BUSPAR Take 10 mg by mouth 2 (two) times daily as needed.   methocarbamol  500 MG tablet Commonly known as: ROBAXIN  Take 1 tablet (500 mg total) by mouth every 8 (eight) hours as needed for muscle spasms.         Subjective   Pt reports feeling well. Denies abdominal/back pain. No nausea. Tolerating diet. Mild pink tinge to urine.   All questions and concerns were addressed at time of discharge.  Objective  Blood pressure (!) 87/57, pulse 61, temperature 97.9 F (36.6 C), temperature source Oral, resp. rate 16, height 5' (1.524 m), weight 52.6 kg, SpO2 97%.   General: Pt is alert, awake, not in acute distress Cardiovascular: RRR, S1/S2 +, no rubs, no gallops Respiratory: CTA bilaterally, no wheezing, no rhonchi Abdominal: Soft, NT, ND, bowel sounds + Extremities: no edema, no cyanosis  The results of significant diagnostics from this hospitalization (including imaging, microbiology, ancillary and laboratory) are listed  below for reference.   Imaging studies: DG OR UROLOGY CYSTO IMAGE ([facility] ONLY) Result Date: 07/27/2024 There is no interpretation for this exam.  This order is for images obtained during a surgical procedure.  Please See Surgeries Tab for more information  regarding the procedure.   CT Renal Stone Study Result Date: 07/26/2024 CLINICAL DATA:  Sepsis. Urinary tract infection. Left flank pain. Nausea, vomiting, and urinary urgency. EXAM: CT ABDOMEN AND PELVIS WITHOUT CONTRAST TECHNIQUE: Multidetector CT imaging of the abdomen and pelvis was performed following the standard protocol without IV contrast. RADIATION DOSE REDUCTION: This exam was performed according to the departmental dose-optimization program which includes automated exposure control, adjustment of the mA and/or kV according to patient size and/or use of iterative reconstruction technique. COMPARISON:  10/06/2023 FINDINGS: Lower chest: Emphysematous changes in the lung bases. Infiltrates in the posterior lung bases likely representing dependent atelectasis. Hepatobiliary: No focal liver abnormality is seen. No gallstones, gallbladder wall thickening, or biliary dilatation. Pancreas: Unremarkable. No pancreatic ductal dilatation or surrounding inflammatory changes. Spleen: Normal in size without focal abnormality. Adrenals/Urinary Tract: No adrenal gland nodules. Bilateral intrarenal calcifications consistent with stones. Largest on the right measures about 3 mm diameter. On the left, there is hydronephrosis and hydroureter with 2 stones, 5 mm and 3 mm diameter, demonstrated in the left ureteropelvic junction. There is mild stranding around the left kidney and ureter which could indicate pyelonephritis or postobstructive change. The bladder is decompressed with otherwise normal appearance. Stomach/Bowel: Stomach, small bowel, and colon are not abnormally distended. No wall thickening or inflammatory changes. Stool throughout the colon. Appendix is not identified. Vascular/Lymphatic: Aortic atherosclerosis. No enlarged abdominal or pelvic lymph nodes. Reproductive: Uterus and bilateral adnexa are unremarkable. Other: No free air or free fluid in the abdomen. Abdominal wall musculature appears intact.  Musculoskeletal: Degenerative changes in the spine. Anterior compression of L1 and L2 vertebra with kyphoplasty at L1, unchanged. No acute bony abnormalities. IMPRESSION: 1. 3 mm and 5 mm stones demonstrated in the left ureteropelvic junction with moderate proximal obstruction. Infiltration around the left kidney and ureter likely represents postobstructive change but could indicate inflammatory process/pyelonephritis. 2. Bilateral nonobstructing intrarenal stones. 3. Emphysematous changes in the lung bases with dependent atelectasis. 4. Aortic atherosclerosis. Electronically Signed   By: Elsie Gravely M.D.   On: 07/26/2024 23:36    Labs: Basic Metabolic Panel: Recent Labs  Lab 07/26/24 2045 07/27/24 0520  NA 136 142  K 3.6 3.9  CL 104 113*  CO2 20* 23  GLUCOSE 154* 136*  BUN 11 9  CREATININE 1.04* 0.75  CALCIUM  8.7* 7.7*   CBC: Recent Labs  Lab 07/26/24 2045 07/27/24 0520  WBC 16.4* 13.5*  HGB 13.5 11.7*  HCT 41.2 35.8*  MCV 98.6 98.4  PLT 182 147*   Microbiology: Results for orders placed or performed during the hospital encounter of 07/26/24  Blood culture (routine x 2)     Status: None (Preliminary result)   Collection Time: 07/26/24 11:47 PM   Specimen: BLOOD  Result Value Ref Range Status   Specimen Description BLOOD BLOOD RIGHT ARM  Final   Special Requests   Final    BOTTLES DRAWN AEROBIC AND ANAEROBIC Blood Culture adequate volume   Culture   Final    NO GROWTH < 12 HOURS Performed at [facility], [address] [place], [place] [address]    Report Status PENDING  Incomplete  Blood culture (routine x 2)     Status: None (Preliminary result)  Collection Time: 07/26/24 11:47 PM   Specimen: BLOOD  Result Value Ref Range Status   Specimen Description BLOOD BLOOD LEFT ARM  Final   Special Requests NONE  Final   Culture   Final    NO GROWTH < 12 HOURS Performed at [facility], [address], [place], [place] [address]    Report  Status PENDING  Incomplete    Time coordinating discharge: Over 30 minutes  Marien LITTIE Piety, MD  [facility] 07/27/2024, 11:22 AM

## 2024-07-27 NOTE — H&P (View-Only) (Signed)
 Urology Consult   Physician requesting consult:  Dr. Ester Sharps  Reason for consult: Left ureteral stones and fever/sepsis  History of Present Illness: Tonya Miller is a 58 y.o. who developed left flank pain approximately 24 hours ago.  She subsequently developed increasing pain associated with fever up to 102 degrees.  She therefore presented to the emergency department and a CT scan revealed a 3 mm and 5 mm obstructing left ureteral calculus.  Her urinalysis indicated evidence of infection.  She has thus far been hemodynamically stable.  Her lactic acid is slightly elevated.  She does take Plavix  with her last dose having been yesterday morning.  She has undergone 2 cardiac stents in 2022.   Past Medical History:  Diagnosis Date   Anxiety    COPD (chronic obstructive pulmonary disease) (HCC)    Muscle spasm   Coronary artery disease  Past Surgical History:  Procedure Laterality Date   Miller SURGERY     kyphoplasty   Miller SURGERY     CESAREAN SECTION     CORONARY STENT INTERVENTION N/A 07/28/2021   Procedure: CORONARY STENT INTERVENTION;  Surgeon: Wonda Sharper, MD;  Location: [facility] INVASIVE CV LAB;  Service: Cardiovascular;  Laterality: N/A;   LEFT HEART CATH AND CORONARY ANGIOGRAPHY N/A 07/28/2021   Procedure: LEFT HEART CATH AND CORONARY ANGIOGRAPHY;  Surgeon: Wonda Sharper, MD;  Location: [facility] INVASIVE CV LAB;  Service: Cardiovascular;  Laterality: N/A;     Current Hospital Medications:  Home meds:  No current facility-administered medications on file prior to encounter.   Current Outpatient Medications on File Prior to Encounter  Medication Sig Dispense Refill   acetaminophen  (TYLENOL ) 500 MG tablet Take 1,000 mg by mouth every 6 (six) hours as needed for moderate pain.     atorvastatin  (LIPITOR ) 80 MG tablet Take 80 mg by mouth daily.     busPIRone (BUSPAR) 10 MG tablet Take 10 mg by mouth 2 (two) times daily as needed.     isosorbide  mononitrate (IMDUR ) 30 MG 24 hr  tablet Take 1 tablet (30 mg total) by mouth daily. 90 tablet 3   methocarbamol  (ROBAXIN ) 500 MG tablet Take 1 tablet (500 mg total) by mouth every 8 (eight) hours as needed for muscle spasms. 60 tablet 0  Plavix   Scheduled Meds: Continuous Infusions:  sodium chloride      cefTRIAXone  (ROCEPHIN )  IV     PRN Meds:.  Allergies: No Known Allergies  Family History  Problem Relation Age of Onset   Heart failure Mother    Heart disease Brother     Social History:  reports that she has been smoking cigarettes. She has never used smokeless tobacco. She reports current drug use. Drug: Marijuana. She reports that she does not drink alcohol.  ROS: A complete review of systems was performed.  All systems are negative except for pertinent findings as noted.  Physical Exam:  Vital signs in last 24 hours: Temp:  [101.2 F (38.4 C)] 101.2 F (38.4 C) (10/10 2042) Pulse Rate:  [94] 94 (10/10 2042) Resp:  [20] 20 (10/10 2042) BP: (125)/(91) 125/91 (10/10 2042) SpO2:  [100 %] 100 % (10/10 2042) Weight:  [47.2 kg] 47.2 kg (10/10 2038) Constitutional:  Alert and oriented, No acute distress Cardiovascular: No JVD Respiratory: Normal respiratory effort GI: Left abdominal tenderness GU: Right left CVA tenderness Lymphatic: No lymphadenopathy Neurologic: Grossly intact, no focal deficits Psychiatric: Normal mood and affect  Laboratory Data:  Recent Labs    07/26/24 2045  WBC  16.4*  HGB 13.5  HCT 41.2  PLT 182    Recent Labs    07/26/24 2045  NA 136  K 3.6  CL 104  GLUCOSE 154*  BUN 11  CALCIUM  8.7*  CREATININE 1.04*     Results for orders placed or performed during the hospital encounter of 07/26/24 (from the past 24 hours)  Urinalysis, Routine w reflex microscopic -Urine, Clean Catch     Status: Abnormal   Collection Time: 07/26/24  8:44 PM  Result Value Ref Range   Color, Urine YELLOW (A) YELLOW   APPearance CLOUDY (A) CLEAR   Specific Gravity, Urine 1.011 1.005 - 1.030    pH 6.0 5.0 - 8.0   Glucose, UA NEGATIVE NEGATIVE mg/dL   Hgb urine dipstick SMALL (A) NEGATIVE   Bilirubin Urine NEGATIVE NEGATIVE   Ketones, ur NEGATIVE NEGATIVE mg/dL   Protein, ur NEGATIVE NEGATIVE mg/dL   Nitrite POSITIVE (A) NEGATIVE   Leukocytes,Ua LARGE (A) NEGATIVE   RBC / HPF 11-20 0 - 5 RBC/hpf   WBC, UA >50 0 - 5 WBC/hpf   Bacteria, UA MANY (A) NONE SEEN   Squamous Epithelial / HPF 0-5 0 - 5 /HPF   Mucus PRESENT   Basic metabolic panel     Status: Abnormal   Collection Time: 07/26/24  8:45 PM  Result Value Ref Range   Sodium 136 135 - 145 mmol/L   Potassium 3.6 3.5 - 5.1 mmol/L   Chloride 104 98 - 111 mmol/L   CO2 20 (L) 22 - 32 mmol/L   Glucose, Bld 154 (H) 70 - 99 mg/dL   BUN 11 6 - 20 mg/dL   Creatinine, Ser 8.95 (H) 0.44 - 1.00 mg/dL   Calcium  8.7 (L) 8.9 - 10.3 mg/dL   GFR, Estimated >39 >39 mL/min   Anion gap 12 5 - 15  CBC     Status: Abnormal   Collection Time: 07/26/24  8:45 PM  Result Value Ref Range   WBC 16.4 (H) 4.0 - 10.5 K/uL   RBC 4.18 3.87 - 5.11 MIL/uL   Hemoglobin 13.5 12.0 - 15.0 g/dL   HCT 58.7 63.9 - 53.9 %   MCV 98.6 80.0 - 100.0 fL   MCH 32.3 26.0 - 34.0 pg   MCHC 32.8 30.0 - 36.0 g/dL   RDW 87.0 88.4 - 84.4 %   Platelets 182 150 - 400 K/uL   nRBC 0.0 0.0 - 0.2 %  Lipase, blood     Status: None   Collection Time: 07/26/24  8:45 PM  Result Value Ref Range   Lipase 24 11 - 51 U/L  Lactic acid, plasma     Status: Abnormal   Collection Time: 07/26/24  8:45 PM  Result Value Ref Range   Lactic Acid, Venous 2.2 (HH) 0.5 - 1.9 mmol/L   No results found for this or any previous visit (from the past 240 hours).  Renal Function: Recent Labs    07/26/24 2045  CREATININE 1.04*   Estimated Creatinine Clearance: 42.4 mL/min (A) (by C-G formula based on SCr of 1.04 mg/dL (H)).  Radiologic Imaging: CT Renal Stone Study Result Date: 07/26/2024 CLINICAL DATA:  Sepsis. Urinary tract infection. Left flank pain. Nausea, vomiting, and  urinary urgency. EXAM: CT ABDOMEN AND PELVIS WITHOUT CONTRAST TECHNIQUE: Multidetector CT imaging of the abdomen and pelvis was performed following the standard protocol without IV contrast. RADIATION DOSE REDUCTION: This exam was performed according to the departmental dose-optimization program which includes automated exposure control,  adjustment of the mA and/or kV according to patient size and/or use of iterative reconstruction technique. COMPARISON:  10/06/2023 FINDINGS: Lower chest: Emphysematous changes in the lung bases. Infiltrates in the posterior lung bases likely representing dependent atelectasis. Hepatobiliary: No focal liver abnormality is seen. No gallstones, gallbladder wall thickening, or biliary dilatation. Pancreas: Unremarkable. No pancreatic ductal dilatation or surrounding inflammatory changes. Spleen: Normal in size without focal abnormality. Adrenals/Urinary Tract: No adrenal gland nodules. Bilateral intrarenal calcifications consistent with stones. Largest on the right measures about 3 mm diameter. On the left, there is hydronephrosis and hydroureter with 2 stones, 5 mm and 3 mm diameter, demonstrated in the left ureteropelvic junction. There is mild stranding around the left kidney and ureter which could indicate pyelonephritis or postobstructive change. The bladder is decompressed with otherwise normal appearance. Stomach/Bowel: Stomach, small bowel, and colon are not abnormally distended. No wall thickening or inflammatory changes. Stool throughout the colon. Appendix is not identified. Vascular/Lymphatic: Aortic atherosclerosis. No enlarged abdominal or pelvic lymph nodes. Reproductive: Uterus and bilateral adnexa are unremarkable. Other: No free air or free fluid in the abdomen. Abdominal wall musculature appears intact. Musculoskeletal: Degenerative changes in the spine. Anterior compression of L1 and L2 vertebra with kyphoplasty at L1, unchanged. No acute bony abnormalities.  IMPRESSION: 1. 3 mm and 5 mm stones demonstrated in the left ureteropelvic junction with moderate proximal obstruction. Infiltration around the left kidney and ureter likely represents postobstructive change but could indicate inflammatory process/pyelonephritis. 2. Bilateral nonobstructing intrarenal stones. 3. Emphysematous changes in the lung bases with dependent atelectasis. 4. Aortic atherosclerosis. Electronically Signed   By: Elsie Gravely M.D.   On: 07/26/2024 23:36    I independently reviewed the above imaging studies.  Impression/Recommendation: Left ureteral calculi with fever/UTI: We discussed her situation in detail.  I have recommended that she proceed with urgent cystoscopy and left ureteral stent placement due to her obstructing left ureteral stones and systemic signs of infection/developing sepsis.  We reviewed the procedure in detail including potential risks, complications, and expected recovery process.  She gives informed consent to proceed.  She will then plan to be admitted to the hospital for ongoing IV antibiotics pending final culture results.  She understands that she will need definitive stone treatment once her infection has been adequately treated.22  Les Amarisa Wilinski 07/27/2024, 12:01 AM  Gretel CANDIE Renda Mickey. MD   CC: Dr. Ester Sharps

## 2024-07-27 NOTE — H&P (Signed)
 History and Physical    Patient: Tonya Miller FMW:[id number] DOB: [birth year] DOA: 07/26/2024 DOS: the patient was seen and examined on 07/27/2024 PCP: Auston Reyes [name], MD  Patient coming from: Home  Chief Complaint:  Chief Complaint  Patient presents with   Flank Pain   Abdominal Pain   HPI: Tonya Miller is a 58 y.o. female with medical history significant of COPD,Essential hypertension, anxiety disorder, hyperlipidemia, prior history of subarachnoid hemorrhage, tobacco abuse who presented to the ER with onset of Bilateral flank and left lower quadrant abdominal pain.  Symptoms started this afternoon and has worsened.  Associated with some nausea vomiting and urinary urgency.  Patient has history of kidney stones but the pain is beyond usual kidney stone attack.  Patient seen in the ER and evaluated.  She has met sepsis criteria with fever leukocytosis.  Also lactic acidosis with lactic acid 2.2.  Urinalysis consistent with UTI.  CT renal study showed 3 mm and 5 mm stones in the left ureteropelvic junction with some evidence of acute pyelonephritis.  Patient is being admitted therefore with suspicion for sepsis due to UTI.  Review of Systems: As mentioned in the history of present illness. All other systems reviewed and are negative. Past Medical History:  Diagnosis Date   Anxiety    COPD (chronic obstructive pulmonary disease) (HCC)    Muscle spasm    Past Surgical History:  Procedure Laterality Date   BACK SURGERY     kyphoplasty   BACK SURGERY     CESAREAN SECTION     CORONARY STENT INTERVENTION N/A 07/28/2021   Procedure: CORONARY STENT INTERVENTION;  Surgeon: Wonda Sharper, MD;  Location: [facility] INVASIVE CV LAB;  Service: Cardiovascular;  Laterality: N/A;   LEFT HEART CATH AND CORONARY ANGIOGRAPHY N/A 07/28/2021   Procedure: LEFT HEART CATH AND CORONARY ANGIOGRAPHY;  Surgeon: Wonda Sharper, MD;  Location: [facility] INVASIVE CV LAB;  Service: Cardiovascular;  Laterality: N/A;    Social History:  reports that she has been smoking cigarettes. She has never used smokeless tobacco. She reports current drug use. Drug: Marijuana. She reports that she does not drink alcohol.  No Known Allergies  Family History  Problem Relation Age of Onset   Heart failure Mother    Heart disease Brother     Prior to Admission medications   Medication Sig Start Date End Date Taking? Authorizing Provider  acetaminophen  (TYLENOL ) 500 MG tablet Take 1,000 mg by mouth every 6 (six) hours as needed for moderate pain.    [provider]  atorvastatin  (LIPITOR ) 80 MG tablet Take 80 mg by mouth daily. 02/17/24   [provider]  busPIRone (BUSPAR) 10 MG tablet Take 10 mg by mouth 2 (two) times daily as needed. 11/30/21   [provider]  isosorbide  mononitrate (IMDUR ) 30 MG 24 hr tablet Take 1 tablet (30 mg total) by mouth daily. 08/01/21   [facility], Jon Garre, PA  methocarbamol  (ROBAXIN ) 500 MG tablet Take 1 tablet (500 mg total) by mouth every 8 (eight) hours as needed for muscle spasms. 04/17/24   Hilma Hastings, PA-C    Physical Exam: Vitals:   07/26/24 2038 07/26/24 2042  BP:  (!) 125/91  Pulse:  94  Resp:  20  Temp:  (!) 101.2 F (38.4 C)  TempSrc:  Oral  SpO2:  100%  Weight: 47.2 kg   Height: 5' (1.524 m)    Constitutional: Acutely ill looking NAD, calm, comfortable Eyes: PERRL, lids and conjunctivae normal ENMT: Mucous  membranes are dry. Posterior pharynx clear of any exudate or lesions.Normal dentition.  Neck: normal, supple, no masses, no thyromegaly Respiratory: clear to auscultation bilaterally, no wheezing, no crackles. Normal respiratory effort. No accessory muscle use.  Cardiovascular: Sinus tachycardia, no murmurs / rubs / gallops. No extremity edema. 2+ pedal pulses. No carotid bruits.  Abdomen: Left lower quadrant and flank tenderness, no masses palpated. No hepatosplenomegaly. Bowel sounds positive.  Musculoskeletal: Good range of motion, no  joint swelling or tenderness, Skin: no rashes, lesions, ulcers. No induration Neurologic: CN 2-12 grossly intact. Sensation intact, DTR normal. Strength 5/5 in all 4.  Psychiatric: Normal judgment and insight. Alert and oriented x 3. Normal mood  Data Reviewed:  Temperature 101.2, blood pressure 125/91, pulse 94.  White count 16.4 CO2 20 creatinine 1.04 calcium  8.7 lactic acid 2.2. Urinalysis showed cloudy urine with a large leukocyte positive nitrite and many bacteria WBC more than 50.  CT renal study showed 3 mm and 5 mm stone at in the left ureteropelvic junction with moderate proximal obstruction.  There is infiltration around the left kidney and ureter likely representing postobstructive change but probably inflammatory process or pyelonephritis.  There are bilateral nonobstructing renal stones and emphysematous changes in the lung bases.  Assessment and Plan:  #1 sepsis due to pyelonephritis: Patient will be admitted.  Aggressive fluid resuscitation and IV antibiotics.  Urine and blood cultures to be obtained.  Urology planning for procedure tonight.  Will continue supportive care and follow the sepsis protocol.  Initial lactic acid is 2.2 will follow the trend.  #2 complex UTI, infected nephrolithiasis.  Continue treatment as above plus urology involvement.  #3 obstructive nephrolithiasis: Patient most likely will get stent tonight.  Urology already consulted.  Will keep patient NPO.  #4 coronary artery disease: Appears stable.  Continue monitoring.  #5 COPD: No acute exacerbation.  Continue to monitor  #6 hyperlipidemia: Continue home regimen  #7 tobacco abuse: Counseling provided.  Will offer nicotine  patch    Advance Care Planning:   Code Status: Prior full code  Consults: Dr. Renda, urologist  Family Communication: No family at bedside  Severity of Illness: The appropriate patient status for this patient is INPATIENT. Inpatient status is judged to be reasonable and  necessary in order to provide the required intensity of service to ensure the patient's safety. The patient's presenting symptoms, physical exam findings, and initial radiographic and laboratory data in the context of their chronic comorbidities is felt to place them at high risk for further clinical deterioration. Furthermore, it is not anticipated that the patient will be medically stable for discharge from the hospital within 2 midnights of admission.   * I certify that at the point of admission it is my clinical judgment that the patient will require inpatient hospital care spanning beyond 2 midnights from the point of admission due to high intensity of service, high risk for further deterioration and high frequency of surveillance required.*  AuthorBETHA SIM KNOLL, MD 07/27/2024 12:03 AM  For on call review [email or web address].

## 2024-07-27 NOTE — Anesthesia Preprocedure Evaluation (Signed)
 Anesthesia Evaluation  Patient identified by MRN, date of birth, ID band Patient awake    Reviewed: Allergy & Precautions, NPO status , Patient's Chart, lab work & pertinent test results  Airway Mallampati: II  TM Distance: >3 FB Neck ROM: full    Dental  (+) Chipped   Pulmonary COPD,  COPD inhaler, Current Smoker   Pulmonary exam normal        Cardiovascular + CAD and + Past MI  Normal cardiovascular exam     Neuro/Psych  PSYCHIATRIC DISORDERS Anxiety     negative neurological ROS     GI/Hepatic negative GI ROS, Neg liver ROS,,,  Endo/Other  negative endocrine ROS    Renal/GU Renal disease     Musculoskeletal   Abdominal   Peds  Hematology negative hematology ROS (+)   Anesthesia Other Findings Past Medical History: No date: Anxiety No date: COPD (chronic obstructive pulmonary disease) (HCC) No date: Muscle spasm  Past Surgical History: No date: BACK SURGERY     Comment:  kyphoplasty No date: BACK SURGERY No date: CESAREAN SECTION 07/28/2021: CORONARY STENT INTERVENTION; N/A     Comment:  Procedure: CORONARY STENT INTERVENTION;  Surgeon:               Wonda Sharper, MD;  Location: [facility] INVASIVE CV LAB;                Service: Cardiovascular;  Laterality: N/A; 07/28/2021: LEFT HEART CATH AND CORONARY ANGIOGRAPHY; N/A     Comment:  Procedure: LEFT HEART CATH AND CORONARY ANGIOGRAPHY;                Surgeon: Wonda Sharper, MD;  Location: [facility] INVASIVE CV               LAB;  Service: Cardiovascular;  Laterality: N/A;  BMI    Body Mass Index: 20.31 kg/m      Reproductive/Obstetrics negative OB ROS                              Anesthesia Physical Anesthesia Plan  ASA: 3 and emergent  Anesthesia Plan: General ETT   Post-op Pain Management:    Induction: Intravenous  PONV Risk Score and Plan: 3 and Ondansetron  and Dexamethasone   Airway Management Planned: Oral  ETT  Additional Equipment:   Intra-op Plan:   Post-operative Plan: Extubation in OR  Informed Consent: I have reviewed the patients History and Physical, chart, labs and discussed the procedure including the risks, benefits and alternatives for the proposed anesthesia with the patient or authorized representative who has indicated his/her understanding and acceptance.     Dental Advisory Given  Plan Discussed with: Anesthesiologist, CRNA and Surgeon  Anesthesia Plan Comments: (Patient consented for risks of anesthesia including but not limited to:  - adverse reactions to medications - damage to eyes, teeth, lips or other oral mucosa - nerve damage due to positioning  - sore throat or hoarseness - Damage to heart, brain, nerves, lungs, other parts of body or loss of life  Patient voiced understanding and assent.)        Anesthesia Quick Evaluation

## 2024-07-27 NOTE — Anesthesia Postprocedure Evaluation (Signed)
 Anesthesia Post Note  Patient: DAISIA SLOMSKI  Procedure(s) Performed: CYSTOSCOPY, WITH STENT INSERTION (Left)  Patient location during evaluation: PACU Anesthesia Type: General Level of consciousness: awake and alert Pain management: pain level controlled Vital Signs Assessment: post-procedure vital signs reviewed and stable Respiratory status: spontaneous breathing, nonlabored ventilation, respiratory function stable and patient connected to nasal cannula oxygen Cardiovascular status: blood pressure returned to baseline and stable Postop Assessment: no apparent nausea or vomiting Anesthetic complications: no   No notable events documented.   Last Vitals:  Vitals:   07/27/24 0330 07/27/24 0345  BP: 101/74 90/72  Pulse:  79  Resp:  18  Temp:  36.8 C  SpO2:  97%    Last Pain:  Vitals:   07/27/24 0406  TempSrc:   PainSc: 6                  Debby Mines

## 2024-07-27 NOTE — Progress Notes (Signed)
 Reviewed discharged instructions with patient. Patient acknowledged understanding. Patient discharged with personal belongings. Patient wheeled out by staff. Patient transported home via family vehicle. No distress noted in patient.

## 2024-07-27 NOTE — Discharge Instructions (Addendum)
 Please take your antibiotic until completed Follow up with urology in about 2 weeks.  Return if you experience a fever or severe pain.   I have held your blood pressure medication- IMDUR  because your blood pressure has been on the lower end while inpatient. This may recover once your infection has been treated so I recommend talking to your doctor on follow up for a recheck of your blood pressure and discuss if the medication needs to be restarted.

## 2024-07-27 NOTE — Plan of Care (Signed)

## 2024-07-27 NOTE — Progress Notes (Signed)
 CODE SEPSIS - PHARMACY COMMUNICATION  **Broad Spectrum Antibiotics should be administered within 1 hour of Sepsis diagnosis**  Time Code Sepsis Called/Page Received:  10/10 @ 2357  Antibiotics Ordered: Ceftriaxone   Time of 1st antibiotic administration: Ceftriaxone  2 gm IV X 1 on 10/11 @ 0026   Additional action taken by pharmacy:   If necessary, Name of Provider/Nurse Contacted:     Ronelle Michie D ,PharmD Clinical Pharmacist  07/27/2024  1:16 AM

## 2024-07-27 NOTE — H&P (Incomplete)
  History and Physical    Patient: Tonya Miller FMW:[id number] DOB: [birth year] DOA: 07/26/2024 DOS: the patient was seen and examined on 07/27/2024 PCP: Tonya Miller [name], MD  Patient coming from: {Point_of_Origin:26[address]}  Chief Complaint:  Chief Complaint  Patient presents with  . Flank Pain  . Abdominal Pain   HPI: Tonya Miller is a 58 y.o. female with medical history significant of ***  Review of Systems: {ROS_Text:26778} Past Medical History:  Diagnosis Date  . Anxiety   . COPD (chronic obstructive pulmonary disease) (HCC)   . Muscle spasm    Past Surgical History:  Procedure Laterality Date  . BACK SURGERY     kyphoplasty  . BACK SURGERY    . CESAREAN SECTION    . CORONARY STENT INTERVENTION N/A 07/28/2021   Procedure: CORONARY STENT INTERVENTION;  Surgeon: Wonda Sharper, MD;  Location: [facility] INVASIVE CV LAB;  Service: Cardiovascular;  Laterality: N/A;  . LEFT HEART CATH AND CORONARY ANGIOGRAPHY N/A 07/28/2021   Procedure: LEFT HEART CATH AND CORONARY ANGIOGRAPHY;  Surgeon: Wonda Sharper, MD;  Location: [facility] INVASIVE CV LAB;  Service: Cardiovascular;  Laterality: N/A;   Social History:  reports that she has been smoking cigarettes. She has never used smokeless tobacco. She reports current drug use. Drug: Marijuana. She reports that she does not drink alcohol.  No Known Allergies  Family History  Problem Relation Age of Onset  . Heart failure Mother   . Heart disease Brother     Prior to Admission medications   Medication Sig Start Date End Date Taking? Authorizing Provider  acetaminophen  (TYLENOL ) 500 MG tablet Take 1,000 mg by mouth every 6 (six) hours as needed for moderate pain.    [provider]  atorvastatin  (LIPITOR ) 80 MG tablet Take 80 mg by mouth daily. 02/17/24   [provider]  busPIRone (BUSPAR) 10 MG tablet Take 10 mg by mouth 2 (two) times daily as needed. 11/30/21   [provider]  isosorbide  mononitrate (IMDUR ) 30 MG 24 hr  tablet Take 1 tablet (30 mg total) by mouth daily. 08/01/21   [facility], Jon Garre, PA  methocarbamol  (ROBAXIN ) 500 MG tablet Take 1 tablet (500 mg total) by mouth every 8 (eight) hours as needed for muscle spasms. 04/17/24   Hilma Hastings, PA-C    Physical Exam: Vitals:   07/26/24 2038 07/26/24 2042  BP:  (!) 125/91  Pulse:  94  Resp:  20  Temp:  (!) 101.2 F (38.4 C)  TempSrc:  Oral  SpO2:  100%  Weight: 47.2 kg   Height: 5' (1.524 m)    *** Data Reviewed: {Tip this will not be part of the note when signed- Document your independent interpretation of telemetry tracing, EKG, lab, Radiology test or any other diagnostic tests. Add any new diagnostic test ordered today. (Optional):26781} {Results:26384}  Assessment and Plan: No notes have been filed under this hospital service. Service: Hospitalist     Advance Care Planning:   Code Status: Prior ***  Consults: ***  Family Communication: ***  Severity of Illness: {Observation/Inpatient:21159}  AuthorBETHA SIM KNOLL, MD 07/27/2024 12:03 AM  For on call review [email or web address].

## 2024-07-27 NOTE — Progress Notes (Incomplete)
 PROGRESS NOTE Tonya Miller    DOB: [birth year], 58 y.o.  FMW:[id number]    Code Status: Full Code   DOA: 07/26/2024   LOS: 0  Brief hospital course  Tonya Miller is a 58 y.o. female with a PMH significant for COPD,Essential hypertension, anxiety disorder, hyperlipidemia, prior history of subarachnoid hemorrhage, tobacco abuse who presented to the ER with onset of Bilateral flank and left lower quadrant abdominal pain.  Symptoms started this afternoon and has worsened.  Associated with some nausea vomiting and urinary urgency.  Patient has history of kidney stones but the pain is beyond usual kidney stone attack.  Patient seen in the ER and evaluated.  She has met sepsis criteria with fever leukocytosis.  Also lactic acidosis with lactic acid 2.2.  Urinalysis consistent with UTI.  CT renal study showed 3 mm and 5 mm stones in the left ureteropelvic junction with some evidence of acute pyelonephritis.  Patient is being admitted therefore with suspicion for sepsis due to UTI.   They presented from *** to the ED on 07/26/2024 with *** x *** days. ***  In the ED, it was found that they had ***.  Significant findings included ***.  They were initially treated with ***.   Patient was admitted to medicine service for further workup and management of *** as outlined in detail below.  07/27/24 -***  Assessment & Plan  Principal Problem:   Sepsis secondary to UTI [facility]) Active Problems:   Coronary artery disease of native artery of native heart with stable angina pectoris   COPD (chronic obstructive pulmonary disease) (HCC)   Tobacco abuse   Hyperlipidemia   Nephrolithiasis   sepsis due to pyelonephritis: Patient will be admitted.  Aggressive fluid resuscitation and IV antibiotics.  Urine and blood cultures to be obtained.  Urology planning for procedure tonight.  Will continue supportive care and follow the sepsis protocol.  Initial lactic acid is 2.2 will follow the trend.   #2 complex UTI,  infected nephrolithiasis.  Continue treatment as above plus urology involvement.   #3 obstructive nephrolithiasis: Patient most likely will get stent tonight.  Urology already consulted.  Will keep patient NPO.   #4 coronary artery disease: Appears stable.  Continue monitoring.   #5 COPD: No acute exacerbation.  Continue to monitor   #6 hyperlipidemia: Continue home regimen   #7 tobacco abuse: Counseling provided.  Will offer nicotine  patch  Body mass index is 22.65 kg/m.  VTE ppx: enoxaparin  (LOVENOX ) injection 40 mg Start: 07/27/24 0800   Diet:     Diet   Diet Heart Room service appropriate? Yes; Fluid consistency: Thin   Consultants: ***  Subjective 07/27/24    Pt reports ***   Objective  Blood pressure (!) 98/58, pulse (!) 57, temperature 98.3 F (36.8 C), temperature source Oral, resp. rate 16, height 5' (1.524 m), weight 52.6 kg, SpO2 98%.  Intake/Output Summary (Last 24 hours) at 07/27/2024 0724 Last data filed at 07/27/2024 0400 Gross per 24 hour  Intake 2489.03 ml  Output --  Net 2489.03 ml   Filed Weights   07/26/24 2038 07/27/24 0345  Weight: 47.2 kg 52.6 kg     Physical Exam: *** General: awake, alert, NAD HEENT: atraumatic, clear conjunctiva, anicteric sclera, MMM, hearing grossly normal Respiratory: normal respiratory effort. Cardiovascular: extremities well perfused, quick capillary refill, normal S1/S2, RRR, no JVD, murmurs Gastrointestinal: soft, NT, ND Nervous: A&O x3. no gross focal neurologic deficits, normal speech Extremities: moves all equally, no edema, normal  tone Skin: dry, intact, normal temperature, normal color. No rashes, lesions or ulcers on exposed skin Psychiatry: normal mood, congruent affect  Labs   I have personally reviewed the following labs and imaging studies CBC    Component Value Date/Time   WBC 13.5 (H) 07/27/2024 0520   RBC 3.64 (L) 07/27/2024 0520   HGB 11.7 (L) 07/27/2024 0520   HGB 13.4 07/02/2014 2145    HCT 35.8 (L) 07/27/2024 0520   HCT 42.4 07/02/2014 2145   PLT 147 (L) 07/27/2024 0520   PLT 238 07/02/2014 2145   MCV 98.4 07/27/2024 0520   MCV 100 07/02/2014 2145   MCH 32.1 07/27/2024 0520   MCHC 32.7 07/27/2024 0520   RDW 13.0 07/27/2024 0520   RDW 13.8 07/02/2014 2145   LYMPHSABS 3.4 08/02/2021 1912   LYMPHSABS 0.4 (L) 11/05/2012 0507   MONOABS 1.4 (H) 08/02/2021 1912   MONOABS 0.0 (L) 11/05/2012 0507   EOSABS 0.1 08/02/2021 1912   EOSABS 0.0 11/05/2012 0507   BASOSABS 0.1 08/02/2021 1912   BASOSABS 0.0 11/05/2012 0507      Latest Ref Rng & Units 07/27/2024    5:20 AM 07/26/2024    8:45 PM 10/23/2023   12:14 PM  BMP  Glucose 70 - 99 mg/dL 863  845  99   BUN 6 - 20 mg/dL 9  11  8    Creatinine 0.44 - 1.00 mg/dL 9.24  8.95  9.19   Sodium 135 - 145 mmol/L 142  136  139   Potassium 3.5 - 5.1 mmol/L 3.9  3.6  4.0   Chloride 98 - 111 mmol/L 113  104  103   CO2 22 - 32 mmol/L 23  20  27    Calcium  8.9 - 10.3 mg/dL 7.7  8.7  9.5     DG OR UROLOGY CYSTO IMAGE ([facility] ONLY) Result Date: 07/27/2024 There is no interpretation for this exam.  This order is for images obtained during a surgical procedure.  Please See Surgeries Tab for more information regarding the procedure.   CT Renal Stone Study Result Date: 07/26/2024 CLINICAL DATA:  Sepsis. Urinary tract infection. Left flank pain. Nausea, vomiting, and urinary urgency. EXAM: CT ABDOMEN AND PELVIS WITHOUT CONTRAST TECHNIQUE: Multidetector CT imaging of the abdomen and pelvis was performed following the standard protocol without IV contrast. RADIATION DOSE REDUCTION: This exam was performed according to the departmental dose-optimization program which includes automated exposure control, adjustment of the mA and/or kV according to patient size and/or use of iterative reconstruction technique. COMPARISON:  10/06/2023 FINDINGS: Lower chest: Emphysematous changes in the lung bases. Infiltrates in the posterior lung bases likely  representing dependent atelectasis. Hepatobiliary: No focal liver abnormality is seen. No gallstones, gallbladder wall thickening, or biliary dilatation. Pancreas: Unremarkable. No pancreatic ductal dilatation or surrounding inflammatory changes. Spleen: Normal in size without focal abnormality. Adrenals/Urinary Tract: No adrenal gland nodules. Bilateral intrarenal calcifications consistent with stones. Largest on the right measures about 3 mm diameter. On the left, there is hydronephrosis and hydroureter with 2 stones, 5 mm and 3 mm diameter, demonstrated in the left ureteropelvic junction. There is mild stranding around the left kidney and ureter which could indicate pyelonephritis or postobstructive change. The bladder is decompressed with otherwise normal appearance. Stomach/Bowel: Stomach, small bowel, and colon are not abnormally distended. No wall thickening or inflammatory changes. Stool throughout the colon. Appendix is not identified. Vascular/Lymphatic: Aortic atherosclerosis. No enlarged abdominal or pelvic lymph nodes. Reproductive: Uterus and bilateral adnexa are unremarkable. Other: No  free air or free fluid in the abdomen. Abdominal wall musculature appears intact. Musculoskeletal: Degenerative changes in the spine. Anterior compression of L1 and L2 vertebra with kyphoplasty at L1, unchanged. No acute bony abnormalities. IMPRESSION: 1. 3 mm and 5 mm stones demonstrated in the left ureteropelvic junction with moderate proximal obstruction. Infiltration around the left kidney and ureter likely represents postobstructive change but could indicate inflammatory process/pyelonephritis. 2. Bilateral nonobstructing intrarenal stones. 3. Emphysematous changes in the lung bases with dependent atelectasis. 4. Aortic atherosclerosis. Electronically Signed   By: Elsie Gravely M.D.   On: 07/26/2024 23:36    Disposition Plan & Communication  Patient status: Inpatient  Admitted From: {From:23814} Planned  disposition location: {PLAN; DISPOSITION:26386} Anticipated discharge date: *** pending ***  Family Communication: ***    Author: Marien LITTIE Piety, DO [facility] 07/27/2024, 7:24 AM   Available by Epic secure chat 7AM-7PM. If 7PM-7AM, please contact night-coverage.  [facility] contact information found on [email or web address].

## 2024-07-27 NOTE — Transfer of Care (Signed)
 Immediate Anesthesia Transfer of Care Note  Patient: Tonya Miller  Procedure(s) Performed: CYSTOSCOPY, WITH STENT INSERTION (Left)  Patient Location: PACU  Anesthesia Type:General  Level of Consciousness: sedated  Airway & Oxygen Therapy: Patient Spontanous Breathing and Patient connected to face mask oxygen  Post-op Assessment: Report given to RN and Post -op Vital signs reviewed and stable  Post vital signs: Reviewed  Last Vitals:  Vitals Value Taken Time  BP 93/64   Temp 98.6   Pulse 73 07/27/24 01:37  Resp 14   SpO2 100 % 07/27/24 01:37  Vitals shown include unfiled device data.  Last Pain:  Vitals:   07/27/24 0037  TempSrc: Oral  PainSc:          Complications: No notable events documented.

## 2024-07-28 ENCOUNTER — Encounter: Payer: Self-pay | Admitting: Urology

## 2024-07-28 ENCOUNTER — Emergency Department

## 2024-07-28 ENCOUNTER — Other Ambulatory Visit: Payer: Self-pay

## 2024-07-28 ENCOUNTER — Emergency Department: Admission: EM | Admit: 2024-07-28 | Discharge: 2024-07-29 | Disposition: A | Discharge status: Home / Self Care

## 2024-07-28 DIAGNOSIS — I10 Essential (primary) hypertension: Secondary | ICD-10-CM | POA: Diagnosis not present

## 2024-07-28 DIAGNOSIS — Z720 Tobacco use: Secondary | ICD-10-CM | POA: Diagnosis not present

## 2024-07-28 DIAGNOSIS — J449 Chronic obstructive pulmonary disease, unspecified: Secondary | ICD-10-CM | POA: Insufficient documentation

## 2024-07-28 DIAGNOSIS — G8918 Other acute postprocedural pain: Secondary | ICD-10-CM | POA: Diagnosis not present

## 2024-07-28 DIAGNOSIS — R1032 Left lower quadrant pain: Secondary | ICD-10-CM | POA: Diagnosis present

## 2024-07-28 DIAGNOSIS — N12 Tubulo-interstitial nephritis, not specified as acute or chronic: Secondary | ICD-10-CM | POA: Insufficient documentation

## 2024-07-28 DIAGNOSIS — D72829 Elevated white blood cell count, unspecified: Secondary | ICD-10-CM | POA: Insufficient documentation

## 2024-07-28 LAB — COMPREHENSIVE METABOLIC PANEL WITH GFR
ALT: 11 U/L (ref 0–44)
AST: 18 U/L (ref 15–41)
Albumin: 3.2 g/dL — ABNORMAL LOW (ref 3.5–5.0)
Alkaline Phosphatase: 70 U/L (ref 38–126)
Anion gap: 10 (ref 5–15)
BUN: 10 mg/dL (ref 6–20)
CO2: 22 mmol/L (ref 22–32)
Calcium: 8.6 mg/dL — ABNORMAL LOW (ref 8.9–10.3)
Chloride: 107 mmol/L (ref 98–111)
Creatinine, Ser: 0.68 mg/dL (ref 0.44–1.00)
GFR, Estimated: >60 mL/min (ref >60)
Glucose, Bld: 102 mg/dL — ABNORMAL HIGH (ref 70–99)
Potassium: 3.4 mmol/L — ABNORMAL LOW (ref 3.5–5.1)
Sodium: 139 mmol/L (ref 135–145)
Total Bilirubin: 0.7 mg/dL (ref 0.0–1.2)
Total Protein: 6.9 g/dL (ref 6.5–8.1)

## 2024-07-28 LAB — CBC WITH DIFFERENTIAL/PLATELET
Abs Immature Granulocytes: 0.03 K/uL (ref 0.00–0.07)
Basophils Absolute: 0 K/uL (ref 0.0–0.1)
Basophils Relative: 0 %
Eosinophils Absolute: 0 K/uL (ref 0.0–0.5)
Eosinophils Relative: 0 %
HCT: 36.7 % (ref 36.0–46.0)
Hemoglobin: 12.4 g/dL (ref 12.0–15.0)
Immature Granulocytes: 0 %
Lymphocytes Relative: 19 %
Lymphs Abs: 2.2 K/uL (ref 0.7–4.0)
MCH: 32.2 pg (ref 26.0–34.0)
MCHC: 33.8 g/dL (ref 30.0–36.0)
MCV: 95.3 fL (ref 80.0–100.0)
Monocytes Absolute: 1.3 K/uL — ABNORMAL HIGH (ref 0.1–1.0)
Monocytes Relative: 11 %
Neutro Abs: 8.1 K/uL — ABNORMAL HIGH (ref 1.7–7.7)
Neutrophils Relative %: 70 %
Platelets: 174 K/uL (ref 150–400)
RBC: 3.85 MIL/uL — ABNORMAL LOW (ref 3.87–5.11)
RDW: 12.8 % (ref 11.5–15.5)
WBC: 11.6 K/uL — ABNORMAL HIGH (ref 4.0–10.5)
nRBC: 0 % (ref 0.0–0.2)

## 2024-07-28 LAB — URINALYSIS, W/ REFLEX TO CULTURE (INFECTION SUSPECTED)
Bacteria, UA: NONE SEEN
Bilirubin Urine: NEGATIVE
Glucose, UA: NEGATIVE mg/dL
Ketones, ur: NEGATIVE mg/dL
Nitrite: NEGATIVE
Protein, ur: NEGATIVE mg/dL
Specific Gravity, Urine: 1.003 — ABNORMAL LOW (ref 1.005–1.030)
pH: 7 (ref 5.0–8.0)

## 2024-07-28 LAB — LACTIC ACID, PLASMA: Lactic Acid, Venous: 0.7 mmol/L (ref 0.5–1.9)

## 2024-07-28 LAB — URINE CULTURE: Culture: NO GROWTH

## 2024-07-28 LAB — PROTIME-INR
INR: 1 (ref 0.8–1.2)
Prothrombin Time: 14 s (ref 11.4–15.2)

## 2024-07-28 LAB — LIPASE, BLOOD: Lipase: 22 U/L (ref 11–51)

## 2024-07-28 MED ORDER — OXYCODONE HCL 5 MG PO TABS: 5.0000 mg | ORAL_TABLET | Freq: Three times a day (TID) | ORAL | 0 refills | Status: AC | PRN

## 2024-07-28 MED ORDER — SODIUM CHLORIDE 0.9 % IV BOLUS: 500.0000 mL | Freq: Once | INTRAVENOUS | Status: DC

## 2024-07-28 MED ORDER — CEFPODOXIME PROXETIL 200 MG PO TABS: 200.0000 mg | ORAL_TABLET | Freq: Two times a day (BID) | ORAL | 0 refills | Status: AC

## 2024-07-28 MED ORDER — IBUPROFEN 200 MG PO TABS: 600.0000 mg | ORAL_TABLET | Freq: Three times a day (TID) | ORAL | 2 refills | Status: AC | PRN

## 2024-07-28 MED ORDER — ONDANSETRON HCL 4 MG/2ML IJ SOLN
4.0000 mg | Freq: Once | INTRAMUSCULAR | Status: AC
  Administered 2024-07-28: 4 mg via INTRAVENOUS
  Filled 2024-07-28: qty 2

## 2024-07-28 MED ORDER — IOHEXOL 300 MG/ML  SOLN
100.0000 mL | Freq: Once | INTRAMUSCULAR | Status: AC | PRN
  Administered 2024-07-28: 100 mL via INTRAVENOUS

## 2024-07-28 MED ORDER — MORPHINE SULFATE (PF) 2 MG/ML IV SOLN
2.0000 mg | Freq: Once | INTRAVENOUS | Status: AC
  Administered 2024-07-28: 2 mg via INTRAVENOUS
  Filled 2024-07-28: qty 1

## 2024-07-28 MED ORDER — SODIUM CHLORIDE 0.9 % IV BOLUS
1000.0000 mL | Freq: Once | INTRAVENOUS | Status: AC
  Administered 2024-07-28: 1000 mL via INTRAVENOUS

## 2024-07-28 MED ORDER — KETOROLAC TROMETHAMINE 15 MG/ML IJ SOLN
15.0000 mg | Freq: Once | INTRAMUSCULAR | Status: AC
  Administered 2024-07-28: 15 mg via INTRAVENOUS
  Filled 2024-07-28: qty 1

## 2024-07-28 MED ORDER — SODIUM CHLORIDE 0.9 % IV SOLN
2.0000 g | Freq: Once | INTRAVENOUS | Status: AC
  Administered 2024-07-28: 2 g via INTRAVENOUS
  Filled 2024-07-28: qty 20

## 2024-07-28 MED ORDER — ACETAMINOPHEN 500 MG PO TABS
1000.0000 mg | ORAL_TABLET | Freq: Four times a day (QID) | ORAL | 2 refills | Status: AC | PRN
Start: 2024-07-28 — End: 2025-07-28

## 2024-07-28 MED ORDER — ONDANSETRON 4 MG PO TBDP: 4.0000 mg | ORAL_TABLET | Freq: Three times a day (TID) | ORAL | 0 refills | Status: AC | PRN

## 2024-07-28 NOTE — ED Provider Notes (Signed)
 [facility] Provider Note    Event Date/Time   First MD Initiated Contact with Patient 07/28/24 2200     (approximate)   History   Post-op Problem  Pt comes in tonight after getting stent placed in left kidney. Pt reports uncontrolled pain across back, incontinence, pressure in left lower abdomen, low grade fever, Last dose of tylenol  2115 this evening.    HPI Tonya Miller is a 58 y.o. female PMH COPD, hypertension, anxiety, hyperlipidemia, prior subarachnoid hemorrhage, tobacco use, recent admission 10/10-10/08/2024 for obstructive urosepsis in the setting of urolithiasis - Patient states she has had worsening severe left lower quadrant abdominal pain, also has had fever up to 101 over the past day, improves with Tylenol .  Some nausea but no vomiting.  Has been having ongoing urinary incontinence.  Pain is severe and radiates into flank.  On further chart review, blood and urine culture subsequently negative during recent hospitalization.  Was discharged on Augmentin.      Physical Exam   Triage Vital Signs: ED Triage Vitals [07/28/24 2152]  Encounter Vitals Group     BP 126/79     Girls Systolic BP Percentile      Girls Diastolic BP Percentile      Boys Systolic BP Percentile      Boys Diastolic BP Percentile      Pulse Rate 65     Resp 18     Temp 99.8 F (37.7 C)     Temp src      SpO2 96 %     Weight 115 lb 8.3 oz (52.4 kg)     Height 5' (1.524 m)     Head Circumference      Peak Flow      Pain Score 8     Pain Loc      Pain Education      Exclude from Growth Chart     Most recent vital signs: Vitals:   07/28/24 2152 07/28/24 2310  BP: 126/79 93/63  Pulse: 65 (!) 57  Resp: 18 19  Temp: 99.8 F (37.7 C)   SpO2: 96% 99%     General: Awake, nontoxic but does appear very uncomfortable CV:  Good peripheral perfusion. RRR, RP 2+ Resp:  Normal effort. CTAB Abd:  Some suprapubic distention, no distention upper abdomen.   Tenderness in left lower quadrant only, moderate left CVA tenderness    ED Results / Procedures / Treatments   Labs (all labs ordered are listed, but only abnormal results are displayed) Labs Reviewed  COMPREHENSIVE METABOLIC PANEL WITH GFR - Abnormal; Notable for the following components:      Result Value   Potassium 3.4 (*)    Glucose, Bld 102 (*)    Calcium  8.6 (*)    Albumin 3.2 (*)    All other components within normal limits  CBC WITH DIFFERENTIAL/PLATELET - Abnormal; Notable for the following components:   WBC 11.6 (*)    RBC 3.85 (*)    Neutro Abs 8.1 (*)    Monocytes Absolute 1.3 (*)    All other components within normal limits  URINALYSIS, W/ REFLEX TO CULTURE (INFECTION SUSPECTED) - Abnormal; Notable for the following components:   Color, Urine STRAW (*)    APPearance CLEAR (*)    Specific Gravity, Urine 1.003 (*)    Hgb urine dipstick LARGE (*)    Leukocytes,Ua LARGE (*)    All other components within normal limits  CULTURE, BLOOD (ROUTINE X  2)  CULTURE, BLOOD (ROUTINE X 2)  URINE CULTURE  LIPASE, BLOOD  LACTIC ACID, PLASMA  PROTIME-INR  LACTIC ACID, PLASMA     EKG  Ecg = sinus rhythm, rate 55, no gross ST elevation or depression, no significant repolarization abnormality, normal axis, intervals.  No evidence of ischemia no arrhythmia on my interpretation.   RADIOLOGY Radiology interpreted by myself and radiology report reviewed.  Urologic stent in place.  Evidence of pyelonephritis.    PROCEDURES:  Critical Care performed: No  Procedures   MEDICATIONS ORDERED IN ED: Medications  cefTRIAXone  (ROCEPHIN ) 2 g in sodium chloride  0.9 % 100 mL IVPB (2 g Intravenous New Bag/Given 07/28/24 2336)  morphine  (PF) 2 MG/ML injection 2 mg (2 mg Intravenous Given 07/28/24 2222)  ondansetron  (ZOFRAN ) injection 4 mg (4 mg Intravenous Given 07/28/24 2223)  ketorolac  (TORADOL ) 15 MG/ML injection 15 mg (15 mg Intravenous Given 07/28/24 2222)  sodium chloride  0.9  % bolus 1,000 mL (0 mLs Intravenous Stopped 07/28/24 2318)  iohexol  (OMNIPAQUE ) 300 MG/ML solution 100 mL (100 mLs Intravenous Contrast Given 07/28/24 2252)     IMPRESSION / MDM / ASSESSMENT AND PLAN / ED COURSE  I reviewed the triage vital signs and the nursing notes.                              DDX/MDM/AP: Differential diagnosis includes, but is not limited to, concern for procedural complication including damage nearby structures or abscess, consider blocking of stent, urinary retention, ongoing pyelonephritis.  Consider development of sepsis/bacteremia given recent infections and procedural intervention.  Plan: - Labs - Pain control, antiemetic, IV fluid - CT abdomen pelvis - Reassess  Patient's presentation is most consistent with acute presentation with potential threat to life or bodily function.  The patient is on the cardiac monitor to evaluate for evidence of arrhythmia and/or significant heart rate changes.  ED course below.  Workup with mild leukocytosis downtrending from prior, urinalysis remains consistent with UTI, urine culture sent.  Renal function normal.  CT with stents in place.  Symptoms well-controlled in the emergency department.  In shared decision making, patient prefers discharge home as opposed to admission given symptoms are well-controlled now.  Treated with ceftriaxone , discontinuing her outpatient Augmentin and started on cefpodoxime.  Plan for close PMD and urology follow-up.  Patient aware that she may be called to come back to emergency department if blood cultures are abnormal and agrees to do this.  Strict ED return precautions in place.  Pain medications prescribed in addition.  Patient agrees with plan.  Clinical Course as of 07/28/24 2338  [place] Jul 28, 2024  2215 CBC with persistent but downtrending leukocytosis [MM]  2320 CMP reviewed, unremarkable [MM]  2320 Urinalysis with gross evidence of infection [MM]  2321 CTAP: IMPRESSION: Interval left  ureteral stent placement. The known proximal left ureteral stone remains in place.  Patchy enhancement within the left kidney suspicious for pyelonephritis. This is best visualized on the delayed images.  Pulmonary emphysema is present. Pulmonary emphysema is an independent risk factor for lung cancer. Recommend patient be considered for lung cancer screening. [place]  Chief Financial Officer. Screening for Lung Cancer: [place]  Licensed conveyancer. JAMA. 2021;325(10):962-970.   [MM]  2332 Patient reevaluated, findings discussed.  Offered admission for observation and following up blood cultures inpatient though patient probably strongly prefers discharge home.  No fever here, lactate normal.  Will discontinue Augmentin, treat with  a dose of 2 g IV ceftriaxone  and discharged on cefpodoxime.  Urine culture collected today, will follow urine cultures and blood cultures as outpatient.  Recommend Tylenol  and Motrin  as needed for any ongoing discomfort and will also prescribe short course of oxycodone  for breakthrough pain in the postprocedural setting.  No evidence of procedural complications at this time however on CT.  Plan for PMD and urology follow-up, counseled to call her urology clinic tomorrow morning to asked them if they would want to see her sooner given her recent ED visit.  ED return precautions in place.  Patient agrees with plan. [MM]    Clinical Course User Index [MM] Clarine Ozell LABOR, MD     FINAL CLINICAL IMPRESSION(S) / ED DIAGNOSES   Final diagnoses:  Pyelonephritis  Other acute postprocedural pain     Rx / DC Orders   ED Discharge Orders          Ordered    cefpodoxime (VANTIN) 200 MG tablet  2 times daily        07/28/24 2325    acetaminophen  (TYLENOL ) 500 MG tablet  Every 6 hours PRN        07/28/24 2325    ibuprofen  (MOTRIN  IB) 200 MG tablet  Every 8 hours PRN        07/28/24 2325    oxyCODONE  (ROXICODONE ) 5 MG immediate  release tablet  Every 8 hours PRN        07/28/24 2325    ondansetron  (ZOFRAN -ODT) 4 MG disintegrating tablet  Every 8 hours PRN        07/28/24 2325             Note:  This document was prepared using Dragon voice recognition software and may include unintentional dictation errors.   Clarine Ozell LABOR, MD 07/28/24 [phone]

## 2024-07-28 NOTE — Discharge Instructions (Signed)
 Your evaluation in the emergency department was notable for an ongoing infection in your kidney, and I have started you on a new antibiotic to treat this given-please take the full course as prescribed.  I also prescribed you multiple pain medications as well as a nausea medication to manage her symptoms.  As discussed, you will be notified of any abnormal blood culture results and you may need to come back to the hospital.  In the meantime, please follow-up with your primary care provider and call your urology clinic tomorrow morning to ask if they would like to see you sooner after having been in the emergency department today.  Return to the emergency department with any new or worsening symptoms.

## 2024-07-28 NOTE — ED Triage Notes (Signed)
 Pt comes in tonight after getting stent placed in left kidney. Pt reports uncontrolled pain across back, incontinence, pressure in left lower abdomen, low grade fever, Last dose of tylenol  2115 this evening.

## 2024-07-29 ENCOUNTER — Other Ambulatory Visit: Payer: Self-pay | Admitting: Urology

## 2024-07-29 DIAGNOSIS — N201 Calculus of ureter: Secondary | ICD-10-CM

## 2024-07-29 LAB — MISC LABCORP TEST (SEND OUT): Labcorp test code: 83935

## 2024-07-29 NOTE — Progress Notes (Signed)
 OR ORDERS FOR DR GEORGANNE FOLLOW UP URS/LL/STENT IN 2-3 WEEKS

## 2024-07-30 ENCOUNTER — Telehealth: Payer: Self-pay

## 2024-07-30 ENCOUNTER — Other Ambulatory Visit: Payer: Self-pay

## 2024-07-30 DIAGNOSIS — N201 Calculus of ureter: Secondary | ICD-10-CM

## 2024-07-30 LAB — URINE CULTURE: Culture: NO GROWTH

## 2024-07-30 MED ORDER — TAMSULOSIN HCL 0.4 MG PO CAPS: 0.4000 mg | ORAL_CAPSULE | Freq: Every day | ORAL | 1 refills | Status: AC

## 2024-07-30 MED ORDER — OXYBUTYNIN CHLORIDE ER 10 MG PO TB24: 10.0000 mg | ORAL_TABLET | Freq: Every day | ORAL | 1 refills | Status: AC

## 2024-07-30 NOTE — Telephone Encounter (Signed)
 Patient called in today- states that she is in severe pain. Reports that she has been having burning with urination, dull, cramping pain in left flank. Patient asking for stent removal. Explained in detail, that patient will require a URS/LL/Stent Exchange in 2 weeks with Dr. Georganne. Advised that I would speak with Dr. Francisca to see if we could send her in something for the pain caused by the stent. Dr. Jairo advised to send Flomax and Oxybutynin, which I have sent to [facility] in [facility] per patient request. Advised patient I would send her information regarding surgery here in the next little bit.

## 2024-07-30 NOTE — Progress Notes (Signed)
 Surgical Physician Order Form [facility] [place]  * Scheduling expectation : 2 to 3 weeks with Dr. Georganne  *Length of Case: 1 hour  *Clearance needed: no  *Anticoagulation Instructions: Hold all anticoagulants  *Aspirin  Instructions: Hold Aspirin   *Post-op visit Date/Instructions:  tbd  *Diagnosis: Left Ureteral Stone  *Procedure: left  Ureteroscopy w/laser lithotripsy & stent exchange (47643)   Additional orders: N/A  -Admit type: OUTpatient  -Anesthesia: General  -VTE Prophylaxis Standing Order SCD's       Other:   -Standing Lab Orders Per Anesthesia    Lab other: None  -Standing Test orders EKG/Chest x-ray per Anesthesia       Test other:   - Medications:  Ancef 2gm IV  -Other orders:  N/A

## 2024-07-30 NOTE — Progress Notes (Signed)
   [facility] Surgical Posting Form  Surgery Date: Date: 08/13/2024  Surgeon: Dr. Penne Skye, MD  Inpt ( No  )   Outpt (Yes)   Obs ( No  )   Diagnosis: N20.1 Left Ureteral Stone  -CPT: [phone]  Surgery: Left Ureteroscopy with Laser Lithotripsy and Stent Exchange  Stop Anticoagulations: May continue all  Cardiac/Medical/Pulmonary Clearance needed: no  *Orders entered into EPIC  Date: 07/30/24   *Case booked in [place]  Date: 07/30/24  *Notified pt of Surgery: Date: 07/30/24  PRE-OP UA & CX: No  *Placed into Prior Authorization Work [place] Date: 07/30/24  Assistant/laser/rep:No

## 2024-07-30 NOTE — Telephone Encounter (Signed)
 Per Dr. Georganne, Patient is to be scheduled for Left Ureteroscopy with Laser Lithotripsy and Stent Exchange   Mrs. Tonya Miller was contacted and possible surgical dates were discussed, Tuesday October 28th, 2025 was agreed upon for surgery.   Patient was directed to call [phone] between 1-3pm the day before surgery to find out surgical arrival time.  Instructions were given not to eat or drink from midnight on the night before surgery and have a driver for the day of surgery. On the surgery day patient was instructed to enter through the Medical Mall entrance of [facility] report the Same Day Surgery desk.   Pre-Admit Testing will be in contact via phone to set up an interview with the anesthesia team to review your history and medications prior to surgery.   Reminder of this information was sent via MyChart to the patient.

## 2024-08-01 LAB — CULTURE, BLOOD (ROUTINE X 2)
Culture: NO GROWTH
Culture: NO GROWTH
Special Requests: ADEQUATE

## 2024-08-02 LAB — CULTURE, BLOOD (ROUTINE X 2)
Culture: NO GROWTH
Culture: NO GROWTH
Special Requests: ADEQUATE

## 2024-08-06 ENCOUNTER — Encounter
Admission: RE | Admit: 2024-08-06 | Discharge: 2024-08-06 | Disposition: A | Discharge status: Home / Self Care | Source: Ambulatory Visit | Attending: Urology | Admitting: Urology

## 2024-08-06 ENCOUNTER — Other Ambulatory Visit: Payer: Self-pay

## 2024-08-06 HISTORY — DX: Sleep apnea, unspecified: G47.30

## 2024-08-06 HISTORY — DX: Personal history of urinary calculi: Z87.442

## 2024-08-06 HISTORY — DX: Atherosclerotic heart disease of native coronary artery without angina pectoris: I25.10

## 2024-08-06 HISTORY — DX: Acute myocardial infarction, unspecified: I21.9

## 2024-08-06 NOTE — Patient Instructions (Addendum)
 Your procedure is scheduled on: Tuesday 08/13/24 Report to the Registration Desk on the 1st floor of the Medical Mall. To find out your arrival time, please call [phone] between 1PM - 3PM on: Monday 08/12/24 If your arrival time is 6:00 am, do not arrive before that time as the Medical Mall entrance doors do not open until 6:00 am.  REMEMBER: Instructions that are not followed completely may result in serious medical risk, up to and including death; or upon the discretion of your surgeon and anesthesiologist your surgery may need to be rescheduled.  Do not eat food or drink any liquids after midnight the night before surgery.  No gum chewing or hard candies.  One week prior to surgery: Stop Anti-inflammatories (NSAIDS) such as Advil , Aleve , Ibuprofen , Motrin , Naproxen , Naprosyn  and Aspirin  based products such as Excedrin, Goody's Powder, BC Powder.  You may however, continue to take Tylenol  if needed for pain up until the day of surgery.  Stop ANY OVER THE COUNTER supplements and vitamins until after surgery.  **Follow recommendations regarding stopping blood thinners.** Continue Plavix  except for the day of your procedure.  Continue taking all of your other prescription medications up until the day of surgery.  ON THE DAY OF SURGERY ONLY TAKE THESE MEDICATIONS WITH SIPS OF WATER:  atorvastatin  (LIPITOR ) 80 MG tablet  tamsulosin (FLOMAX) 0.4 MG CAPS capsule  busPIRone (BUSPAR) 10 MG tablet if needed tizanidine (ZANAFLEX) 2 MG capsule if needed HYDROcodone -acetaminophen  (NORCO/VICODIN) 5-325 MG tablet if needed  No Alcohol for 24 hours before or after surgery.  No Smoking including e-cigarettes for 24 hours before surgery.  No chewable tobacco products for at least 6 hours before surgery.  No nicotine  patches on the day of surgery.  Do not use any recreational drugs for at least a week (preferably 2 weeks) before your surgery.  Please be advised that the combination of  cocaine and anesthesia may have negative outcomes, up to and including death. If you test positive for cocaine, your surgery will be cancelled.  On the morning of surgery brush your teeth with toothpaste and water, you may rinse your mouth with mouthwash if you wish. Do not swallow any toothpaste or mouthwash.  Bathe prior to arrival for your procedure  Do not wear lotions, powders, or perfumes.   Do not shave body hair from the neck down 48 hours before surgery.  Wear comfortable clothing (specific to your surgery type) to the hospital.  Do not wear jewelry, make-up, hairpins, clips or nail polish.  For welded (permanent) jewelry: bracelets, anklets, waist bands, etc.  Please have this removed prior to surgery.  If it is not removed, there is a chance that hospital personnel will need to cut it off on the day of surgery.  Contact lenses, hearing aids and dentures may not be worn into surgery.  Do not bring valuables to the hospital. [facility] is not responsible for any missing/lost belongings or valuables.   Notify your doctor if there is any change in your medical condition (cold, fever, infection, rash).  After surgery, you can help prevent lung complications by doing breathing exercises.  Take deep breaths and cough every 1-2 hours. Your doctor may order a device called an Incentive Spirometer to help you take deep breaths. . If you are being discharged the day of surgery, you will not be allowed to drive home. You will need a responsible individual to drive you home and stay with you for 24 hours after surgery.  Please call the Pre-admissions Testing Dept. at [phone] if you have any questions about these instructions.  Surgery Visitation Policy:  Patients having surgery or a procedure may have two visitors.  Children under the age of 70 must have an adult with them who is not the patient.   Merchandiser, retail to address health-related social needs:   [email or web address]

## 2024-08-08 ENCOUNTER — Encounter: Payer: Self-pay | Admitting: Urgent Care

## 2024-08-08 NOTE — Progress Notes (Signed)
 Perioperative / Anesthesia Services  Pre-Admission Testing Clinical Review / Pre-Operative Anesthesia Consult  Date: 08/19/24  PATIENT DEMOGRAPHICS: Name: Tonya Miller DOB: Dec 27, 1965 MRN:   [id number]  Note: Available PAT nursing documentation and vital signs have been reviewed. Clinical nursing staff has updated patient's PMH/PSHx, current medication list, and drug allergies/intolerances to ensure complete and comprehensive history available to assist care teams in MDM as it pertains to the aforementioned surgical procedure and anticipated anesthetic course. Extensive review of available clinical information personally performed. Nursing documentation reviewed. [place] PMH and PSHx updated with any diagnoses and/or procedures that I have knowledge of that may have been inadvertently omitted during her intake with the pre-admission testing department's nursing staff.  PLANNED SURGICAL PROCEDURE(S):   Case: [id number] Date/Time: 08/20/24 1057   Procedure: CYSTOSCOPY/URETEROSCOPY/HOLMIUM LASER/STENT PLACEMENT (Left) - EXCHANGE   Anesthesia type: General   Diagnosis: Left ureteral stone [N20.1]   Pre-op diagnosis: Left Ureteral Stone   Location: [facility] OR ROOM 10 / [facility] AN[facility]   Surgeons: Georganne Penne SAUNDERS, MD        CLINICAL DISCUSSION: EMILIJA BOHMAN is a 58 y.o. female who is submitted for pre-surgical anesthesia review and clearance prior to her undergoing the above procedure. Patient is a Current Smoker. Pertinent PMH includes: CAD, NSTEMI, aortic atherosclerosis, subarachnoid hemorrhage, BILATERAL carotid artery disease, HTN, HLD, COPD, OSAH (no nocturnal PAP therapy), pulmonary nodule, nephrolithiasis, recent urosepsis, cervical DDD, scoliosis, marijuana use, anxiety.  Patient is followed by cardiology Jodeen, MD). She was last seen in the cardiology clinic on 08/15/2024; notes reviewed. At the time of her clinic visit, patient doing well overall from a  cardiovascular perspective. Patient denied any chest pain, shortness of breath, PND, orthopnea, palpitations, significant peripheral edema, weakness, fatigue, vertiginous symptoms, or presyncope/syncope. Patient with a past medical history significant for cardiovascular diagnoses. Documented physical exam was grossly benign, providing no evidence of acute exacerbation and/or decompensation of the patient's known cardiovascular conditions.  Patient suffered an NSTEMI on 07/28/2021.  Troponin peaked at 1609 ng/L.  Patient underwent diagnostic LEFT heart catheterization revealing multivessel CAD: 70% proximal RCA, 80% mid RCA, 70% distal LCx, and 50% proximal to mid LAD.  PCI was subsequently performed placing a 3.0 x 18 mm Onyx Frontier DES x 1 to the proximal RCA and a 2.5 x 15 mm Onyx Frontier DES x 1 to the mid RCA.  Procedure yielded excellent angiographic result and TIMI-3 flow.  Most recent TTE performed on 07/29/2021 revealed a normal left ventricular systolic function with an EF of 6-65%. There was no LVH.  There were no regional wall motion abnormalities.  Left ventricular diastolic Doppler parameters were normal.  Right ventricular size and function normal with a TAPSE measuring 2.5 cm  (normal range >/= 1.6 cm).  There was trivial mitral, tricuspid, and pulmonary valve regurgitation.  All transvalvular gradients were noted to be normal providing no evidence of hemodynamically significant valvular stenosis. Aorta normal in size with no evidence of ectasia or aneurysmal dilatation.  Following stent placement, patient remains on daily antithrombotic therapy using clopidogrel .  Patient reported be compliant therapy with no evidence or reports of GI/GU related bleeding.  Blood pressure well controlled at 120/64 mmHg; takes no oral antihypertensives..  Patient is on atorvastatin  for her HLD diagnosis and ASCVD prevention. Patient is not diabetic. She does have an OSAH diagnosis, however does not require the  use of nocturnal PAP therapy. Patient is able to complete all of her  ADL/IADLs without cardiovascular  limitation.  Per the DASI, patient is able to achieve at least 4 METS of physical activity without experiencing any significant degree of angina/anginal equivalent symptoms. No changes were made to her medication regimen during her visit with cardiology.  Patient scheduled to follow-up with outpatient cardiology in 6 months or sooner if needed.  Hendricks [name] Larve is scheduled for an elective CYSTOSCOPY/URETEROSCOPY/HOLMIUM LASER/STENT PLACEMENT (Left) on 08/13/2024 with Dr. Penne JONELLE Skye, MD. Given patient's past medical history significant for cardiovascular diagnoses, presurgical cardiac clearance was sought by the PAT team. Per cardiology, this patient is optimized for surgery and may proceed with the planned procedural course with a MODERATE risk of significant perioperative cardiovascular complications.  In review of the patient's medication reconciliation, it is noted that she is on daily oral antithrombotic therapy. Given that patient's past medical history is significant for cardiovascular diagnoses, including but not limited to CAD, urology has cleared patient to continue her daily clopidogrel  throughout her perioperative course.  Patient has been updated on these directives from her specialty care providers by the PAT team.  Patient denies previous perioperative complications with anesthesia in the past. In review her EMR, it is noted that patient underwent a general anesthetic course here at [facility] ([facility]) in 07/2024 without documented complications.   MOST RECENT VITAL SIGNS:    08/06/2024   11:28 AM 07/29/2024   12:00 AM 07/28/2024   11:37 PM  Vitals with BMI  Height 5' 1    Weight 104 lbs    BMI 19.66    Systolic  95 93  Diastolic  71 64  Pulse  56 58   PROVIDERS/SPECIALISTS: NOTE: Primary physician provider listed below. Patient may  have been seen by APP or partner within same practice.   PROVIDER ROLE / SPECIALTY LAST SHERLEAN Skye Penne JONELLE, MD Urology (Surgeon) 07/29/2024  Auston Reyes [name], MD Primary Care Provider 07/30/2024  Wilburn Fillers, MD Cardiology 08/15/2024   ALLERGIES: No Known Allergies  CURRENT HOME MEDICATIONS: No current facility-administered medications for this encounter.    acetaminophen  (TYLENOL ) 500 MG tablet   acetaminophen  (TYLENOL ) 500 MG tablet   atorvastatin  (LIPITOR ) 80 MG tablet   busPIRone (BUSPAR) 10 MG tablet   clopidogrel  (PLAVIX ) 75 MG tablet   ibuprofen  (MOTRIN  IB) 200 MG tablet   methocarbamol  (ROBAXIN ) 500 MG tablet   ondansetron  (ZOFRAN -ODT) 4 MG disintegrating tablet   oxybutynin (DITROPAN-XL) 10 MG 24 hr tablet   oxyCODONE  (ROXICODONE ) 5 MG immediate release tablet   tamsulosin (FLOMAX) 0.4 MG CAPS capsule   tizanidine (ZANAFLEX) 2 MG capsule   HISTORY: Past Medical History:  Diagnosis Date   Anxiety    Aortic atherosclerosis    Bilateral carotid artery disease    COPD (chronic obstructive pulmonary disease) (HCC)    Coronary artery disease    a.) NSTEMI 07/28/2021: IRA 70% pRCA (3.0 x 18 mm Onyx Frontier DES) and 80% mRCA (2.5 x 15 mm Onyx Frontier DES)   DDD (degenerative disc disease), cervical    Dyspnea    History of 2019 novel coronavirus disease (COVID-19)    History of kidney stones    HLD (hyperlipidemia)    HTN (hypertension)    Long term current use of clopidogrel     Marijuana use    Muscle spasm    NSTEMI (non-ST elevated myocardial infarction) (HCC) 07/28/2021   a.) troponin peaked at 1609 ng/L; b.) LHC/PCI 07/28/2021: 70% pRCA (3.0 x 18 mm Onyx Frontier DES), 80% mRCA (  2.5 x 15 mm Onyx Frontier DES), 70% dLCx, 50% p-mLAD   Pulmonary nodule    SAH (subarachnoid hemorrhage) (HCC) 09/05/2017   a.) CT head 09/05/2017: tiny focus of acute subarachnoid hemorrhage at lateral RIGHT frontal region   Scoliosis    Sepsis secondary to UTI (HCC)  07/26/2024   Sleep apnea    a.) does not require nocturnal PAP therapy   Tobacco abuse    Vitamin D deficiency    Past Surgical History:  Procedure Laterality Date   CESAREAN SECTION     CORONARY STENT INTERVENTION N/A 07/28/2021   Procedure: CORONARY STENT INTERVENTION;  Surgeon: Wonda Sharper, MD;  Location: [facility] INVASIVE CV LAB;  Service: Cardiovascular;  Laterality: N/A;   CYSTOSCOPY WITH STENT PLACEMENT Left 07/27/2024   Procedure: CYSTOSCOPY, WITH STENT INSERTION;  Surgeon: Renda Glance, MD;  Location: [facility] ORS;  Service: Urology;  Laterality: Left;   KYPHOPLASTY N/A    LEFT HEART CATH AND CORONARY ANGIOGRAPHY N/A 07/28/2021   Procedure: LEFT HEART CATH AND CORONARY ANGIOGRAPHY;  Surgeon: Wonda Sharper, MD;  Location: [facility] INVASIVE CV LAB;  Service: Cardiovascular;  Laterality: N/A;   Family History  Problem Relation Age of Onset   Heart failure Mother    Heart disease Brother    Social History   Tobacco Use   Smoking status: Every Day    Current packs/day: 0.50    Types: Cigarettes   Smokeless tobacco: Never  Substance Use Topics   Alcohol use: No   LABS:  Lab Results  Component Value Date   WBC 11.6 (H) 07/28/2024   HGB 12.4 07/28/2024   HCT 36.7 07/28/2024   MCV 95.3 07/28/2024   PLT 174 07/28/2024   Lab Results  Component Value Date   NA 139 07/28/2024   CL 107 07/28/2024   K 3.4 (L) 07/28/2024   CO2 22 07/28/2024   BUN 10 07/28/2024   CREATININE 0.68 07/28/2024   GFRNONAA >60 07/28/2024   CALCIUM  8.6 (L) 07/28/2024   ALBUMIN 3.2 (L) 07/28/2024   GLUCOSE 102 (H) 07/28/2024    ECG: Date: 07/28/2024  Time ECG obtained: 2308 PM Rate: 55 bpm Rhythm: normal sinus Axis (leads I and aVF): normal Intervals: PR 173 ms. QRS 81 ms. QTc 428 ms. ST segment and T wave changes: No evidence of acute T wave abnormalities or significant ST segment elevation or depression.  Evidence of a possible, age undetermined, prior infarct:  No Comparison: Similar to  previous tracing obtained on 10/23/2023   IMAGING / PROCEDURES: CT ABDOMEN PELVIS W CONTRAST performed on 07/28/2024 Interval left ureteral stent placement. The known proximal left ureteral stone remains in place. Patchy enhancement within the left kidney suspicious for pyelonephritis. This is best visualized on the delayed images. Pulmonary emphysema is present. Pulmonary emphysema is an independent risk factor for lung cancer. Recommend patient be considered for lung cancer screening. [place]  Chief Financial Officer. Screening for Lung Cancer: [place]  Licensed Conveyancer. JAMA. 2021;325(10):962-970.  MR CERVICAL SPINE WO CONTRAST performed on 03/19/2024 Degenerative changes as above. Disc osteophyte complex at C4-5 is slightly increased from prior. Subtle flattening of the ventral cervical cord at C4-5 without cord signal abnormality. Moderate foraminal stenosis on the left at C6-7 is increased from prior.  MR LUMBAR SPINE WO CONTRAST performed on 03/19/2024 Chronic L1 and L2 vertebral body fractures. Focal kyphosis at this level No disc protrusion or degenerative stenosis.  DG SCOLIOSIS EVAL COMPLETE SPINE 2 OR 3 VIEWS performed on 03/25/2024 13  degrees of levoscoliosis upper thoracic spine, apex at T4. Reversed cervical lordosis centered at C4-5 with degenerative changes. No evidence of cervical fractures. No acute the lumbar fractures. Chronic moderate anterior wedge compression fracture deformities of L1 and L2, treated with L1 kyphoplasty. Exaggerated kyphotic angle centered at L1 measuring 49 degrees, unchanged. Grade 1 L2-3 and L3-4 retrolisthesis, unchanged. Emphysema with scarring changes in the upper lobes and chronic interstitial changes in the lower lung fields. Age advanced vascular calcifications. Nonobstructing left renal caliceal stones  TRANSTHORACIC ECHOCARDIOGRAM performed on 07/29/2021 Left ventricular ejection fraction, by  estimation, is 60 to 65%. The left ventricle has normal function. The left ventricle has no regional wall motion abnormalities. Left ventricular diastolic parameters were normal.  Right ventricular systolic function is normal. The right ventricular size is normal. There is normal pulmonary artery systolic pressure.  The mitral valve is normal in structure. Trivial mitral valve regurgitation. No evidence of mitral stenosis.  The aortic valve is normal in structure. Aortic valve regurgitation is not visualized. No aortic stenosis is present.  The inferior vena cava is normal in size with greater than 50% respiratory variability, suggesting right atrial pressure of 3 mmHg.   LEFT HEART CATHETERIZATION AND CORONARY ANGIOGRAPHY performed on 07/28/2021 Normal LVEF by ventriculography, estimated at 55 to 60% with no wall motion abnormalities Multivessel CAD Prox RCA lesion is 70% stenosed. Mid RCA lesion is 80% stenosed. Dist Cx lesion is 70% stenosed. Prox LAD to Mid LAD lesion is 50% stenosed. Successful PCI 3.0 x 18 mm Onyx Frontier DES x 1 to the proximal RCA 2.5 x 15 mm Onyx Frontier DES x 1 to the mid RCA Procedure yielded excellent angiographic result and TIMI-3 flow. Recommendations: aspirin  and ticagrelor  x12 months as tolerated, follow cardiac markers, tobacco cessation and risk reduction measures with post MI medical therapy.   IMPRESSION AND PLAN: LYNELLE WEILER has been referred for pre-anesthesia review and clearance prior to her undergoing the planned anesthetic and procedural courses. Available labs, pertinent testing, and imaging results were personally reviewed by me in preparation for upcoming operative/procedural course. [facility] medical record has been updated following extensive record review and patient interview with PAT staff.   This patient has been appropriately cleared by cardiology with an overall MODERATE risk of patient experiencing significant perioperative  cardiovascular complications. Based on clinical review performed today (08/19/24), barring any significant acute changes in the patient's overall condition, it is anticipated that she will be able to proceed with the planned surgical intervention. Any acute changes in clinical condition may necessitate her procedure being postponed and/or cancelled. Patient will meet with anesthesia team (MD and/or CRNA) on the day of her procedure for preoperative evaluation/assessment. Questions regarding anesthetic course will be fielded at that time.   Pre-surgical instructions were reviewed with the patient during his PAT appointment, and questions were fielded to satisfaction by PAT clinical staff. She has been instructed on which medications that she will need to hold prior to surgery, as well as the ones that have been deemed safe/appropriate to take on the day of her procedure. As part of the general education provided by PAT, patient made aware both verbally and in writing, that she would need to abstain from the use of any illegal substances during her perioperative course. She was advised that failure to follow the provided instructions could necessitate case cancellation or result in serious perioperative complications up to and including death. Patient encouraged to contact PAT and/or her surgeon's office to discuss any  questions or concerns that may arise prior to surgery; verbalized understanding.   Dorise Pereyra, MSN, APRN, FNP-C, CEN [facility]  Perioperative Services Nurse Practitioner Phone: [phone] Fax: [phone] 08/19/24 5:21 PM  NOTE: This note has been prepared using Dragon dictation software. Despite my best ability to proofread, there is always the potential that unintentional transcriptional errors may still occur from this process.

## 2024-08-12 ENCOUNTER — Other Ambulatory Visit: Payer: Self-pay

## 2024-08-12 NOTE — Telephone Encounter (Addendum)
 Spoke with patient. Patient needing to see Cardiology per Anesthesia. Patient is seeing [facility] on Thursday 11/30- have moved her case to 11/4 with Dr. Georganne, she is aware to hold Plavix  for 5 days prior to surgery starting on 10/30. Patient request additional Hydrocodone  to be sent to pharmacy since surgery date extended. Informed patient I would send to Dr. Georganne, but that he is out of the office/OR until tomorrow. Patient verbalized understanding.

## 2024-08-13 ENCOUNTER — Other Ambulatory Visit: Payer: Self-pay | Admitting: Urology

## 2024-08-13 MED ORDER — HYDROCODONE-ACETAMINOPHEN 7.5-325 MG PO TABS: 1.0000 | ORAL_TABLET | Freq: Four times a day (QID) | ORAL | 0 refills | Status: AC | PRN

## 2024-08-13 MED ORDER — HYDROCODONE-ACETAMINOPHEN 5-325 MG PO TABS: 1.0000 | ORAL_TABLET | Freq: Four times a day (QID) | ORAL | 0 refills | Status: DC | PRN

## 2024-08-13 NOTE — Progress Notes (Signed)
 Refilling Norco for kidney stone discomfort until surgery. She should take Norco as a last resort for discomfort. She currently has an indwelling ureteral stent, and associated discomfort is much better managed with Tylenol /NSAIDs/ bladder spasm agents.

## 2024-08-15 NOTE — Progress Notes (Signed)
 New Patient Visit    Chief Complaint: Preop evaluation  Date of Service: 08/15/2024 Date of Birth: 10-Sep-1966 PCP: Auston Reyes BIRCH, MD  History of Present Illness: Tonya Miller is a 58 y.o.female patient who presented for preop evaluation.  Past medical history significant for CAD with NSTEMI status post PCI proximal to mid RCA 07/2021 with nonobstructive moderate CAD in LAD/left circumflex, COPD, tobacco abuse, hyperlipidemia, hypertension.  Echocardiogram 07/2021 with normal biventricular systolic function and no significant valvular abnormality.  Today patient details that she is overall doing well.  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  Stays reasonably active playing with grandchildren.  Can walk up and down flight of stairs and walk around for few minutes without any complaints.  No chest pain/pressure.  Has chronic mild exertional dyspnea which is unchanged.  No palpitation, dizziness or syncope.  Recent EKG showed sinus rhythm without any ischemic changes.  Past Medical and Surgical History  Past Medical History No past medical history on file.  Past Surgical History She has a past surgical history that includes Cesarean section and kyphoplasty (N/A).   Medications and Allergies  Current Medications Current Outpatient Medications  Medication Sig Dispense Refill  . albuterol  MDI, PROVENTIL , VENTOLIN , PROAIR , HFA (VENTOLIN  HFA) 90 mcg/actuation inhaler Inhale 2 inhalations into the lungs every 6 (six) hours as needed for Wheezing 1 each 5  . atorvastatin  (LIPITOR ) 80 MG tablet Take 1 tablet (80 mg total) by mouth once daily 90 tablet 3  . biotin 10,000 mcg Cap Take 10,000 mcg by mouth once daily    . busPIRone (BUSPAR) 10 MG tablet Take 1 tablet by mouth twice daily 180 tablet 0  . clopidogreL  (PLAVIX ) 75 mg tablet Take 1 tablet (75 mg total) by mouth once daily 30 tablet 11  . cyanocobalamin (VITAMIN B12) 1000 MCG tablet Take 1,000 mcg by mouth once daily    . omega  3-dha-epa-fish oil (FISH OIL) 1,000 mg (120 mg-180 mg) Cap Take 1 capsule by mouth once daily    . tamsulosin (FLOMAX) 0.4 mg capsule Take 0.4 mg by mouth once daily    . tiZANidine (ZANAFLEX) 2 MG tablet Take 1 tablet (2 mg total) by mouth 3 (three) times daily (Patient taking differently: Take 2 mg by mouth 3 (three) times daily as needed for Muscle spasms) 90 tablet 1  . gabapentin  (NEURONTIN ) 100 MG capsule 1 po qHS x 4 days, then bid x 4 days, then tid (Patient not taking: Reported on 08/15/2024) 90 capsule 0  . methocarbamoL  (ROBAXIN ) 500 MG tablet Take 500 mg by mouth every 8 (eight) hours as needed (Patient not taking: Reported on 08/15/2024)    . nicotine  (NICODERM CQ ) 21 mg/24 hr patch Place 1 patch onto the skin daily (Patient not taking: Reported on 08/15/2024) 30 patch 2  . PARoxetine (PAXIL) 20 MG tablet Take 1 tablet (20 mg total) by mouth once daily (Patient not taking: Reported on 05/06/2024) 30 tablet 11   No current facility-administered medications for this visit.    Allergies Patient has no known allergies.  Social and Family History  Social History  reports that she has been smoking cigarettes. She has never used smokeless tobacco. She reports that she does not currently use alcohol. Drug use questions deferred to the physician.  Family History family history is not on file.   Review of Systems   Review of Systems: The patient denies chest pain, shortness of breath, orthopnea, paroxysmal nocturnal dyspnea, pedal edema, palpitations,  heart racing, presyncope, syncope.    Physical Examination   Vitals:BP 120/64 (BP Location: Left upper arm, Patient Position: Sitting, BP Cuff Size: Adult)   Pulse 67   Resp 12   Ht 152.4 cm (5')   Wt 45.4 kg (100 lb)   SpO2 95%   BMI 19.53 kg/m  Ht:152.4 cm (5') Wt:45.4 kg (100 lb) ADJ:Anib surface area is 1.39 meters squared. Body mass index is 19.53 kg/m.  HEENT: Pupils equally reactive to light and accomodation  Neck:  Supple, no significant JVD Lungs: clear to auscultation bilaterally; no wheezes, rales, rhonchi Heart: Regular rate and rhythm. No murmur Extremities: no pedal edema  Assessment and Plan   58 y.o. female with  Preop evaluation, surgery for renal stones CAD with prior PCI of RCA 2022 Hypertension Hyperlipidemia Tobacco abuse-continues to smoke COPD  Stable from cardiac standpoint without any anginal symptoms, METs greater than 4. Euvolemic on exam and blood pressure well-controlled Regarding preop risk stratification, she will be at intermediate risk for moderate risk surgery.  No further cardiac evaluation is indicated at this time.  Optimized from cardiac standpoint. Can hold Plavix  for 5 days for surgery and restart afterwards. Continue statin  No orders of the defined types were placed in this encounter.   Return in about 6 months (around 02/13/2025).  KRISHNA CHAITANYA ALLURI, MD  This dictation was prepared with dragon dictation. Any transcription errors that result from this process are unintentional.

## 2024-08-19 MED ORDER — CEFAZOLIN SODIUM-DEXTROSE 2-4 GM/100ML-% IV SOLN
2.0000 g | INTRAVENOUS | Status: AC
  Administered 2024-08-20: 2 g via INTRAVENOUS

## 2024-08-19 MED ORDER — CHLORHEXIDINE GLUCONATE 0.12 % MT SOLN
15.0000 mL | Freq: Once | OROMUCOSAL | Status: AC
  Administered 2024-08-20: 15 mL via OROMUCOSAL

## 2024-08-19 MED ORDER — LACTATED RINGERS IV SOLN: INTRAVENOUS | Status: DC

## 2024-08-19 MED ORDER — ORAL CARE MOUTH RINSE: 15.0000 mL | Freq: Once | OROMUCOSAL | Status: AC

## 2024-08-20 ENCOUNTER — Ambulatory Visit

## 2024-08-20 ENCOUNTER — Ambulatory Visit: Payer: Self-pay | Admitting: Urgent Care

## 2024-08-20 ENCOUNTER — Other Ambulatory Visit: Payer: Self-pay

## 2024-08-20 ENCOUNTER — Encounter: Payer: Self-pay | Admitting: Urology

## 2024-08-20 ENCOUNTER — Ambulatory Visit
Admission: RE | Admit: 2024-08-20 | Discharge: 2024-08-20 | Disposition: A | Discharge status: Home / Self Care | Attending: Urology | Admitting: Urology

## 2024-08-20 ENCOUNTER — Encounter
Admission: RE | Disposition: A | Discharge status: Home / Self Care | Payer: Self-pay | Source: Home / Self Care | Attending: Urology

## 2024-08-20 DIAGNOSIS — I10 Essential (primary) hypertension: Secondary | ICD-10-CM | POA: Diagnosis not present

## 2024-08-20 DIAGNOSIS — J439 Emphysema, unspecified: Secondary | ICD-10-CM | POA: Diagnosis not present

## 2024-08-20 DIAGNOSIS — Z79899 Other long term (current) drug therapy: Secondary | ICD-10-CM | POA: Diagnosis not present

## 2024-08-20 DIAGNOSIS — Z955 Presence of coronary angioplasty implant and graft: Secondary | ICD-10-CM | POA: Insufficient documentation

## 2024-08-20 DIAGNOSIS — G4733 Obstructive sleep apnea (adult) (pediatric): Secondary | ICD-10-CM | POA: Insufficient documentation

## 2024-08-20 DIAGNOSIS — E785 Hyperlipidemia, unspecified: Secondary | ICD-10-CM | POA: Diagnosis not present

## 2024-08-20 DIAGNOSIS — F419 Anxiety disorder, unspecified: Secondary | ICD-10-CM | POA: Diagnosis not present

## 2024-08-20 DIAGNOSIS — Z7902 Long term (current) use of antithrombotics/antiplatelets: Secondary | ICD-10-CM | POA: Diagnosis not present

## 2024-08-20 DIAGNOSIS — I252 Old myocardial infarction: Secondary | ICD-10-CM | POA: Insufficient documentation

## 2024-08-20 DIAGNOSIS — Z8249 Family history of ischemic heart disease and other diseases of the circulatory system: Secondary | ICD-10-CM | POA: Insufficient documentation

## 2024-08-20 DIAGNOSIS — I25119 Atherosclerotic heart disease of native coronary artery with unspecified angina pectoris: Secondary | ICD-10-CM | POA: Insufficient documentation

## 2024-08-20 DIAGNOSIS — I2510 Atherosclerotic heart disease of native coronary artery without angina pectoris: Secondary | ICD-10-CM | POA: Insufficient documentation

## 2024-08-20 DIAGNOSIS — F17210 Nicotine dependence, cigarettes, uncomplicated: Secondary | ICD-10-CM | POA: Insufficient documentation

## 2024-08-20 DIAGNOSIS — N201 Calculus of ureter: Secondary | ICD-10-CM

## 2024-08-20 DIAGNOSIS — M419 Scoliosis, unspecified: Secondary | ICD-10-CM | POA: Insufficient documentation

## 2024-08-20 DIAGNOSIS — I700 Atherosclerosis of aorta: Secondary | ICD-10-CM | POA: Insufficient documentation

## 2024-08-20 HISTORY — DX: Dyspnea, unspecified: R06.00

## 2024-08-20 HISTORY — DX: Atherosclerosis of aorta: I70.0

## 2024-08-20 HISTORY — DX: Disorder of arteries and arterioles, unspecified: I77.9

## 2024-08-20 HISTORY — DX: Hyperlipidemia, unspecified: E78.5

## 2024-08-20 HISTORY — DX: Personal history of COVID-19: Z86.16

## 2024-08-20 HISTORY — DX: Long term (current) use of antithrombotics/antiplatelets: Z79.02

## 2024-08-20 HISTORY — DX: Tobacco use: Z72.0

## 2024-08-20 HISTORY — DX: Solitary pulmonary nodule: R91.1

## 2024-08-20 HISTORY — DX: Cannabis use, unspecified, uncomplicated: F12.90

## 2024-08-20 HISTORY — DX: Scoliosis, unspecified: M41.9

## 2024-08-20 HISTORY — PX: CYSTOSCOPY/URETEROSCOPY/HOLMIUM LASER/STENT PLACEMENT: SHX6546

## 2024-08-20 HISTORY — DX: Other cervical disc degeneration, unspecified cervical region: M50.30

## 2024-08-20 HISTORY — DX: Vitamin D deficiency, unspecified: E55.9

## 2024-08-20 HISTORY — DX: Essential (primary) hypertension: I10

## 2024-08-20 SURGERY — CYSTOSCOPY/URETEROSCOPY/HOLMIUM LASER/STENT PLACEMENT
Anesthesia: General | Site: Ureter | Laterality: Left

## 2024-08-20 MED ORDER — GLYCOPYRROLATE 0.2 MG/ML IJ SOLN
INTRAMUSCULAR | Status: DC | PRN
  Administered 2024-08-20: .2 mg via INTRAVENOUS

## 2024-08-20 MED ORDER — SUCCINYLCHOLINE CHLORIDE 200 MG/10ML IV SOSY
PREFILLED_SYRINGE | INTRAVENOUS | Status: DC | PRN
  Administered 2024-08-20: 60 mg via INTRAVENOUS

## 2024-08-20 MED ORDER — OXYBUTYNIN CHLORIDE ER 5 MG PO TB24: 5.0000 mg | ORAL_TABLET | Freq: Every day | ORAL | 0 refills | Status: AC

## 2024-08-20 MED ORDER — DEXAMETHASONE SOD PHOSPHATE PF 10 MG/ML IJ SOLN
INTRAMUSCULAR | Status: DC | PRN
  Administered 2024-08-20: 10 mg via INTRAVENOUS

## 2024-08-20 MED ORDER — CEFAZOLIN SODIUM-DEXTROSE 2-4 GM/100ML-% IV SOLN
INTRAVENOUS | Status: AC
  Filled 2024-08-20: qty 100

## 2024-08-20 MED ORDER — OXYCODONE HCL 5 MG/5ML PO SOLN: 5.0000 mg | Freq: Once | ORAL | Status: DC | PRN

## 2024-08-20 MED ORDER — ROCURONIUM BROMIDE 100 MG/10ML IV SOLN
INTRAVENOUS | Status: DC | PRN
  Administered 2024-08-20: 30 mg via INTRAVENOUS
  Administered 2024-08-20: 5 mg via INTRAVENOUS

## 2024-08-20 MED ORDER — STERILE WATER FOR IRRIGATION IR SOLN
Status: DC | PRN
Start: 2024-08-20 — End: 2024-08-20
  Administered 2024-08-20: 500 mL

## 2024-08-20 MED ORDER — TAMSULOSIN HCL 0.4 MG PO CAPS: 0.4000 mg | ORAL_CAPSULE | Freq: Every day | ORAL | 0 refills | Status: AC

## 2024-08-20 MED ORDER — SEVOFLURANE IN SOLN
RESPIRATORY_TRACT | Status: AC
  Filled 2024-08-20: qty 250

## 2024-08-20 MED ORDER — SODIUM CHLORIDE 0.9 % IR SOLN
Status: DC | PRN
Start: 2024-08-20 — End: 2024-08-20
  Administered 2024-08-20: 3000 mL

## 2024-08-20 MED ORDER — FENTANYL CITRATE (PF) 100 MCG/2ML IJ SOLN: 25.0000 ug | INTRAMUSCULAR | Status: DC | PRN

## 2024-08-20 MED ORDER — OXYCODONE HCL 5 MG PO TABS: 5.0000 mg | ORAL_TABLET | Freq: Once | ORAL | Status: DC | PRN

## 2024-08-20 MED ORDER — FENTANYL CITRATE (PF) 100 MCG/2ML IJ SOLN
INTRAMUSCULAR | Status: AC
  Filled 2024-08-20: qty 2

## 2024-08-20 MED ORDER — ACETAMINOPHEN 10 MG/ML IV SOLN
INTRAVENOUS | Status: DC | PRN
  Administered 2024-08-20: 1000 mg via INTRAVENOUS

## 2024-08-20 MED ORDER — LIDOCAINE HCL (CARDIAC) PF 100 MG/5ML IV SOSY
PREFILLED_SYRINGE | INTRAVENOUS | Status: DC | PRN
  Administered 2024-08-20: 60 mg via INTRAVENOUS

## 2024-08-20 MED ORDER — IOHEXOL 180 MG/ML  SOLN
INTRAMUSCULAR | Status: DC | PRN
  Administered 2024-08-20: 10 mL

## 2024-08-20 MED ORDER — PHENYLEPHRINE 80 MCG/ML (10ML) SYRINGE FOR IV PUSH (FOR BLOOD PRESSURE SUPPORT)
PREFILLED_SYRINGE | INTRAVENOUS | Status: DC | PRN
  Administered 2024-08-20 (×2): 160 ug via INTRAVENOUS

## 2024-08-20 MED ORDER — SUGAMMADEX SODIUM 200 MG/2ML IV SOLN
INTRAVENOUS | Status: DC | PRN
  Administered 2024-08-20: 160 mg via INTRAVENOUS

## 2024-08-20 MED ORDER — EPHEDRINE SULFATE-NACL 50-0.9 MG/10ML-% IV SOSY
PREFILLED_SYRINGE | INTRAVENOUS | Status: DC | PRN
  Administered 2024-08-20: 5 mg via INTRAVENOUS

## 2024-08-20 MED ORDER — CHLORHEXIDINE GLUCONATE 0.12 % MT SOLN
OROMUCOSAL | Status: AC
  Filled 2024-08-20: qty 15

## 2024-08-20 MED ORDER — ACETAMINOPHEN 10 MG/ML IV SOLN
INTRAVENOUS | Status: AC
  Filled 2024-08-20: qty 100

## 2024-08-20 MED ORDER — ALBUTEROL SULFATE HFA 108 (90 BASE) MCG/ACT IN AERS
INHALATION_SPRAY | RESPIRATORY_TRACT | Status: DC | PRN
  Administered 2024-08-20: 4 via RESPIRATORY_TRACT

## 2024-08-20 MED ORDER — CIPROFLOXACIN HCL 500 MG PO TABS: ORAL_TABLET | ORAL | 0 refills | Status: AC

## 2024-08-20 MED ORDER — MIDAZOLAM HCL 2 MG/2ML IJ SOLN
INTRAMUSCULAR | Status: AC
  Filled 2024-08-20: qty 2

## 2024-08-20 MED ORDER — PHENAZOPYRIDINE HCL 200 MG PO TABS: 200.0000 mg | ORAL_TABLET | Freq: Three times a day (TID) | ORAL | 0 refills | Status: AC | PRN

## 2024-08-20 MED ORDER — ACETAMINOPHEN 10 MG/ML IV SOLN: 1000.0000 mg | Freq: Once | INTRAVENOUS | Status: DC | PRN

## 2024-08-20 MED ORDER — ONDANSETRON HCL 4 MG/2ML IJ SOLN
INTRAMUSCULAR | Status: DC | PRN
  Administered 2024-08-20 (×2): 4 mg via INTRAVENOUS

## 2024-08-20 MED ORDER — FENTANYL CITRATE (PF) 100 MCG/2ML IJ SOLN
INTRAMUSCULAR | Status: DC | PRN
  Administered 2024-08-20: 50 ug via INTRAVENOUS

## 2024-08-20 MED ORDER — PROPOFOL 10 MG/ML IV BOLUS
INTRAVENOUS | Status: DC | PRN
  Administered 2024-08-20: 150 mg via INTRAVENOUS

## 2024-08-20 MED ORDER — DROPERIDOL 2.5 MG/ML IJ SOLN: 0.6250 mg | Freq: Once | INTRAMUSCULAR | Status: DC | PRN

## 2024-08-20 SURGICAL SUPPLY — 25 items
ADHESIVE MASTISOL STRL (MISCELLANEOUS) IMPLANT
BAG DRAIN SIEMENS DORNER NS (MISCELLANEOUS) ×1 IMPLANT
BAG PRESSURE INF REUSE 3000 (BAG) ×1 IMPLANT
BASKET ZERO TIP 1.9FR (BASKET) IMPLANT
BRUSH SCRUB EZ 4% CHG (MISCELLANEOUS) ×1 IMPLANT
CATH URET FLEX-TIP 2 LUMEN 10F (CATHETERS) IMPLANT
CATH URETL OPEN 5X70 (CATHETERS) IMPLANT
DRAPE UTILITY 15X26 TOWEL STRL (DRAPES) ×1 IMPLANT
DRSG TEGADERM 2-3/8X2-3/4 SM (GAUZE/BANDAGES/DRESSINGS) IMPLANT
FIBER LASER MOSES 200 DFL (Laser) IMPLANT
GLOVE BIOGEL PI IND STRL 7.0 (GLOVE) ×1 IMPLANT
GOWN STRL REUS W/ TWL LRG LVL3 (GOWN DISPOSABLE) ×2 IMPLANT
GUIDEWIRE STR DUAL SENSOR (WIRE) ×1 IMPLANT
GUIDEWIRE STRT TIP .038X150X3 (WIRE) IMPLANT
KIT TURNOVER CYSTO (KITS) ×1 IMPLANT
PACK CYSTO AR (MISCELLANEOUS) ×1 IMPLANT
SET CYSTO IRRIGATION (SET/KITS/TRAYS/PACK) ×1 IMPLANT
SHEATH NAVIGATOR HD 12/14X36 (SHEATH) IMPLANT
SOL .9 NS 3000ML IRR UROMATIC (IV SOLUTION) ×1 IMPLANT
STENT URET 6FRX24 CONTOUR (STENTS) IMPLANT
STENT URET 6FRX26 CONTOUR (STENTS) IMPLANT
SURGILUBE 2OZ TUBE FLIPTOP (MISCELLANEOUS) ×1 IMPLANT
SYR 10ML LL (SYRINGE) ×1 IMPLANT
VALVE UROSEAL ADJ ENDO (VALVE) IMPLANT
WATER STERILE IRR 500ML POUR (IV SOLUTION) ×1 IMPLANT

## 2024-08-20 NOTE — Anesthesia Procedure Notes (Signed)
 Procedure Name: Intubation Date/Time: 08/20/2024 10:26 AM  Performed by: Ledora Duncan, CRNAPre-anesthesia Checklist: Patient identified, Emergency Drugs available, Suction available and Patient being monitored Patient Re-evaluated:Patient Re-evaluated prior to induction Oxygen Delivery Method: Circle system utilized Preoxygenation: Pre-oxygenation with 100% oxygen Induction Type: IV induction Ventilation: Mask ventilation without difficulty Laryngoscope Size: McGrath and 3 Grade View: Grade I Tube size: 6.5 mm Number of attempts: 1 Placement Confirmation: ETT inserted through vocal cords under direct vision, positive ETCO2 and breath sounds checked- equal and bilateral Secured at: 20 cm Tube secured with: Tape Dental Injury: Teeth and Oropharynx as per pre-operative assessment

## 2024-08-20 NOTE — Anesthesia Preprocedure Evaluation (Signed)
 Anesthesia Evaluation  Patient identified by MRN, date of birth, ID band Patient awake    Reviewed: Allergy & Precautions, H&P , NPO status , Patient's Chart, lab work & pertinent test results, reviewed documented beta blocker date and time   Airway Mallampati: II  TM Distance: >3 FB Neck ROM: full    Dental  (+) Teeth Intact   Pulmonary shortness of breath and with exertion, sleep apnea , pneumonia, COPD,  COPD inhaler, Current Smoker   Pulmonary exam normal        Cardiovascular Exercise Tolerance: Poor hypertension, On Medications + angina with exertion + CAD and + Past MI  Normal cardiovascular exam Rhythm:regular Rate:Normal     Neuro/Psych  PSYCHIATRIC DISORDERS Anxiety     negative neurological ROS     GI/Hepatic negative GI ROS, Neg liver ROS,,,  Endo/Other  negative endocrine ROS    Renal/GU Renal disease  negative genitourinary   Musculoskeletal   Abdominal   Peds  Hematology negative hematology ROS (+)   Anesthesia Other Findings Past Medical History: No date: Anxiety No date: Aortic atherosclerosis No date: Bilateral carotid artery disease No date: COPD (chronic obstructive pulmonary disease) (HCC) No date: Coronary artery disease     Comment:  a.) NSTEMI 07/28/2021: IRA 70% pRCA (3.0 x 18 mm Onyx               Frontier DES) and 80% mRCA (2.5 x 15 mm Onyx Frontier               DES) No date: DDD (degenerative disc disease), cervical No date: Dyspnea No date: History of 2019 novel coronavirus disease (COVID-19) No date: History of kidney stones No date: HLD (hyperlipidemia) No date: HTN (hypertension) No date: Long term current use of clopidogrel  No date: Marijuana use No date: Muscle spasm 07/28/2021: NSTEMI (non-ST elevated myocardial infarction) (HCC)     Comment:  a.) troponin peaked at 1609 ng/L; b.) LHC/PCI               07/28/2021: 70% pRCA (3.0 x 18 mm Onyx Frontier DES), 80%               mRCA (2.5 x 15 mm Onyx Frontier DES), 70% dLCx, 50%               p-mLAD No date: Pulmonary nodule 09/05/2017: SAH (subarachnoid hemorrhage) (HCC)     Comment:  a.) CT head 09/05/2017: tiny focus of acute subarachnoid              hemorrhage at lateral RIGHT frontal region No date: Scoliosis 07/26/2024: Sepsis secondary to UTI (HCC) No date: Sleep apnea     Comment:  a.) does not require nocturnal PAP therapy No date: Tobacco abuse No date: Vitamin D deficiency Past Surgical History: No date: CESAREAN SECTION 07/28/2021: CORONARY STENT INTERVENTION; N/A     Comment:  Procedure: CORONARY STENT INTERVENTION;  Surgeon:               Wonda Sharper, MD;  Location: [facility] INVASIVE CV LAB;                Service: Cardiovascular;  Laterality: N/A; 07/27/2024: CYSTOSCOPY WITH STENT PLACEMENT; Left     Comment:  Procedure: CYSTOSCOPY, WITH STENT INSERTION;  Surgeon:               Renda Glance, MD;  Location: [facility] ORS;  Service:  Urology;  Laterality: Left; No date: KYPHOPLASTY; N/A 07/28/2021: LEFT HEART CATH AND CORONARY ANGIOGRAPHY; N/A     Comment:  Procedure: LEFT HEART CATH AND CORONARY ANGIOGRAPHY;                Surgeon: Wonda Sharper, MD;  Location: [facility] INVASIVE CV               LAB;  Service: Cardiovascular;  Laterality: N/A;   Reproductive/Obstetrics negative OB ROS                              Anesthesia Physical Anesthesia Plan  ASA: 3  Anesthesia Plan: General ETT   Post-op Pain Management:    Induction:   PONV Risk Score and Plan: 3  Airway Management Planned:   Additional Equipment:   Intra-op Plan:   Post-operative Plan:   Informed Consent: I have reviewed the patients History and Physical, chart, labs and discussed the procedure including the risks, benefits and alternatives for the proposed anesthesia with the patient or authorized representative who has indicated his/her understanding and acceptance.     Dental  Advisory Given  Plan Discussed with: CRNA  Anesthesia Plan Comments:         Anesthesia Quick Evaluation

## 2024-08-20 NOTE — Op Note (Signed)
 Preoperative diagnosis:  Left ureteral stones   Postoperative diagnosis:  Left ureteral stones   Procedure:  Cystoscopy Left ureteroscopy with laser lithotripsy Left retrograde pyelography with interpretation  Left ureteral stent exchange  Surgeon: Penne Skye, MD  Anesthesia: General  Complications: None  Intraoperative findings: Two stacked 3-92mm stones within the Left mid ureter. Fully treated with laser lithotripsy. Small residual fragments basket extracted from retrograde movement into kidney Temporary 34F x 24cm JJ ureteral stent with string placed  EBL: Minimal  Specimens:  Left renal stone  Indication: Tonya Miller is a 58 y.o. patient with: Obstructive Left ureteral stones (3 and 5mm proximally) with infection/sepsis S/p Left ureteral stent placement 07/27/24 Ucx - no growth  After reviewing the management options for treatment, he elected to proceed with the above surgical procedure(s). We have discussed the potential benefits and risks of the procedure, side effects of the proposed treatment, the likelihood of the patient achieving the goals of the procedure, and any potential problems that might occur during the procedure or recuperation. Informed consent has been obtained.  Description of procedure:  The patient was taken to the operating room and general anesthesia was induced.  The patient was placed in the dorsal lithotomy position, prepped and draped in the usual sterile fashion, and preoperative antibiotics were administered. A preoperative time-out was performed.   Cystourethroscopy was performed.  The patient's urethra was examined and was normal. The bladder was then systematically examined in its entirety. There was no evidence for any bladder tumors, stones, or other mucosal pathology.  Bilateral ureteral orifices noted in orthotopic location.  Attention then turned to the Left ureteral orifice where a preexisting stent was controlled with flexible  graspers and externalized. A wire was passed through the stent into the left renal pelvis, followed by an open ended catheter. Omnipaque  contrast was injected through the ureteral catheter and a retrograde pyelogram was performed with findings: decompressed left renal pelvis, simple calyceal system.   Next we transition to a flexible ureteroscope which was passed alongside our wire.  Stone burden was encountered at the mid ureter consistent with preoperative imaging.  A 200 m holmium laser was used to fully treat the stone with laser lithotripsy.  The stone fragmented easily.  Some small fragments migrated retrograde into the kidney.  We did ultimately place a ureteral access sheath due to the difficulty with repeated cannulation of the ureteral orifice with a ureteroscope.  The access sheath was placed under fluoroscopic guidance.  Next, a zero-tip wire basket was introduced and all sizeable stone fragments were extracted., A pull-back retrograde pyelogram and direct ureteroscopy was performed. This did not reveal any residual stone fragments, filling defects or contrast extravasation. , and Complete pyeloscopy was performed and no residual stone fragments were noted.  We elected to place a temporary indwelling stent with a string. Our sensor guidewire was replaced and a 34F x 24 cm JJ stent , which was placed via fluoroscopic guidance. A spot KUB confirmed appropriate proximal and distal curl locations.   The bladder was then emptied and the procedure ended.  The patient appeared to tolerate the procedure well and without complications.  The patient was able to be awakened and transferred to the recovery unit in satisfactory condition.   Plan: - remove stent at home in 3 days and - follow up in clinic in 4-6 weeks with PA, RBUS prior to visit  Penne Skye, M.D.

## 2024-08-20 NOTE — Discharge Instructions (Addendum)
 DISCHARGE INSTRUCTIONS FOR KIDNEY STONE/URETERAL STENT   MEDICATIONS:  1.  Resume all your other meds from home - except do not take any extra narcotic pain meds that you may have at home.  2. Pyridium is to help with the burning/stinging when you urinate. 3. Take Cipro one hour prior to removal of your stent.   ACTIVITY:  1. No strenuous activity x 1week  2. No driving while on narcotic pain medications  3. Drink plenty of water  4. Continue to walk at home - you can still get blood clots when you are at home, so keep active, but don't over do it.  5. May return to work/school tomorrow or when you feel ready   BATHING:  1. You can shower and we recommend daily showers  2. You have a string coming from your urethra: The stent string is attached to your ureteral stent. Do not pull on this.   SIGNS/SYMPTOMS TO CALL:  Please call us  if you have a fever greater than 101.5, uncontrolled nausea/vomiting, uncontrolled pain, dizziness, unable to urinate, bloody urine, chest pain, shortness of breath, leg swelling, leg pain, redness around wound, drainage from wound, or any other concerns or questions.   You can reach us  at [phone].   FOLLOW-UP:  1. You will have an appointment in 6 weeks with a ultrasound of your kidneys prior.   2. You have a string attached to your stent, you may remove it on Friday 08/23/24. To do this, pull the strings until the stents are completely removed. You may feel an odd sensation in your back.

## 2024-08-20 NOTE — Transfer of Care (Signed)
 Immediate Anesthesia Transfer of Care Note  Patient: Tonya Miller  Procedure(s) Performed: CYSTOSCOPY/URETEROSCOPY/HOLMIUM LASER/STENT EXCHANGE (Left: Ureter)  Patient Location: PACU  Anesthesia Type:General  Level of Consciousness: drowsy and patient cooperative  Airway & Oxygen Therapy: Patient Spontanous Breathing and Patient connected to face mask oxygen  Post-op Assessment: Report given to RN and Post -op Vital signs reviewed and stable  Post vital signs: Reviewed and stable  Last Vitals:  Vitals Value Taken Time  BP 116/75 08/20/24 11:16  Temp 36.8 C 08/20/24 11:16  Pulse 85 08/20/24 11:19  Resp 16 08/20/24 11:19  SpO2 98 % 08/20/24 11:19  Vitals shown include unfiled device data.  Last Pain:  Vitals:   08/20/24 1116  TempSrc:   PainSc: Asleep         Complications: No notable events documented.

## 2024-08-20 NOTE — Interval H&P Note (Signed)
 History and Physical Interval Note:  08/20/2024 10:23 AM  Tonya Miller  has presented today for surgery, with the diagnosis of Left Ureteral Stone.  The various methods of treatment have been discussed with the patient and family. After consideration of risks, benefits and other options for treatment, the patient has consented to  Procedure(s): CYSTOSCOPY/URETEROSCOPY/HOLMIUM LASER/STENT EXCHANGE (Left) as a surgical intervention.  The patient's history has been reviewed, patient examined, no change in status, stable for surgery.  I have reviewed the patient's chart and labs.  Questions were answered to the patient's satisfaction.    Cardiac: RRR Lungs: CTA bilaterally  Laterality: LEft Procedure: Left URS/LL with stent exchange vs removal  Informed consent obtained, we specifically discussed the risks of bleeding, infection, post-operative pain, need for additional procedures. Operative site appropriately marked where indicated  Tonya JONELLE Skye, MD 08/20/2024   Tonya Miller

## 2024-08-20 NOTE — Anesthesia Postprocedure Evaluation (Signed)
 Anesthesia Post Note  Patient: Tonya Miller  Procedure(s) Performed: CYSTOSCOPY/URETEROSCOPY/HOLMIUM LASER/STENT EXCHANGE (Left: Ureter)  Patient location during evaluation: PACU Anesthesia Type: General Level of consciousness: awake and alert Pain management: pain level controlled Vital Signs Assessment: post-procedure vital signs reviewed and stable Respiratory status: spontaneous breathing, nonlabored ventilation, respiratory function stable and patient connected to nasal cannula oxygen Cardiovascular status: blood pressure returned to baseline and stable Postop Assessment: no apparent nausea or vomiting Anesthetic complications: no   No notable events documented.   Last Vitals:  Vitals:   08/20/24 1145 08/20/24 1211  BP: 119/75 101/66  Pulse: 76   Resp: 12   Temp: 36.7 C   SpO2: 97% (P) 100%    Last Pain:  Vitals:   08/20/24 1211  TempSrc:   PainSc: 0-No pain                 Lynwood KANDICE Clause

## 2024-08-21 ENCOUNTER — Other Ambulatory Visit: Payer: Self-pay

## 2024-08-21 ENCOUNTER — Encounter: Payer: Self-pay | Admitting: Urology

## 2024-08-21 DIAGNOSIS — N201 Calculus of ureter: Secondary | ICD-10-CM

## 2024-08-23 ENCOUNTER — Emergency Department
Admission: EM | Admit: 2024-08-23 | Discharge: 2024-08-24 | Disposition: A | Discharge status: Home / Self Care | Attending: Emergency Medicine | Admitting: Emergency Medicine

## 2024-08-23 DIAGNOSIS — Z5321 Procedure and treatment not carried out due to patient leaving prior to being seen by health care provider: Secondary | ICD-10-CM | POA: Diagnosis not present

## 2024-08-23 DIAGNOSIS — R110 Nausea: Secondary | ICD-10-CM | POA: Diagnosis not present

## 2024-08-23 DIAGNOSIS — R42 Dizziness and giddiness: Secondary | ICD-10-CM | POA: Diagnosis present

## 2024-08-23 LAB — CBC
HCT: 42.8 % (ref 36.0–46.0)
Hemoglobin: 13.7 g/dL (ref 12.0–15.0)
MCH: 31.5 pg (ref 26.0–34.0)
MCHC: 32 g/dL (ref 30.0–36.0)
MCV: 98.4 fL (ref 80.0–100.0)
Platelets: 218 K/uL (ref 150–400)
RBC: 4.35 MIL/uL (ref 3.87–5.11)
RDW: 12.5 % (ref 11.5–15.5)
WBC: 10 K/uL (ref 4.0–10.5)
nRBC: 0 % (ref 0.0–0.2)

## 2024-08-23 LAB — COMPREHENSIVE METABOLIC PANEL WITH GFR
ALT: 11 U/L (ref 0–44)
AST: 20 U/L (ref 15–41)
Albumin: 3.7 g/dL (ref 3.5–5.0)
Alkaline Phosphatase: 84 U/L (ref 38–126)
Anion gap: 13 (ref 5–15)
BUN: 17 mg/dL (ref 6–20)
CO2: 27 mmol/L (ref 22–32)
Calcium: 9 mg/dL (ref 8.9–10.3)
Chloride: 101 mmol/L (ref 98–111)
Creatinine, Ser: 0.99 mg/dL (ref 0.44–1.00)
GFR, Estimated: >60 mL/min (ref >60)
Glucose, Bld: 98 mg/dL (ref 70–99)
Potassium: 4 mmol/L (ref 3.5–5.1)
Sodium: 141 mmol/L (ref 135–145)
Total Bilirubin: 0.6 mg/dL (ref 0.0–1.2)
Total Protein: 7.5 g/dL (ref 6.5–8.1)

## 2024-08-23 LAB — URINALYSIS, ROUTINE W REFLEX MICROSCOPIC
Bacteria, UA: NONE SEEN
Bilirubin Urine: NEGATIVE
Glucose, UA: NEGATIVE mg/dL
Hgb urine dipstick: NEGATIVE
Ketones, ur: NEGATIVE mg/dL
Leukocytes,Ua: NEGATIVE
Nitrite: POSITIVE — AB
Protein, ur: NEGATIVE mg/dL
Specific Gravity, Urine: 1.006 (ref 1.005–1.030)
pH: 7 (ref 5.0–8.0)

## 2024-08-23 NOTE — ED Triage Notes (Signed)
 Pt to ED for dizziness and nauses since today around 430pm. Had kidney stone removed and stent placed this week and she removed the stent this AM. Denies CP, SOB.

## 2024-08-29 LAB — STONE ANALYSIS
Calcium Oxalate Monohydrate: 70 %
Calcium Phosphate (Hydroxyl): 30 %
Weight Calculi: 4 mg

## 2024-09-10 ENCOUNTER — Telehealth: Payer: Self-pay

## 2024-09-10 NOTE — Telephone Encounter (Signed)
 Patient called the triage line reporting burning with urination, nausea, and flank pain. She denies fever and hematuria. Patient reports she had a ureteral stent removed a couple of weeks ago.  An office visit was offered for 09/10/2024, but the patient stated she would be unable to pay the copay. An alternative appointment for next week was offered; the patient declined and stated she will call back if symptoms persist.  The patient was advised to seek urgent care or emergency evaluation if her symptoms continue or worsen. She voiced understanding.

## 2024-09-11 NOTE — Telephone Encounter (Signed)
 2nd attempt to reach pt to schedule appt today for symptoms.   Also wanted to let pt know she can also be billed her copay instead of paying it during her visit.

## 2024-09-18 ENCOUNTER — Ambulatory Visit
Admission: RE | Admit: 2024-09-18 | Discharge: 2024-09-18 | Disposition: A | Source: Ambulatory Visit | Attending: Urology

## 2024-09-18 DIAGNOSIS — N201 Calculus of ureter: Secondary | ICD-10-CM | POA: Diagnosis present

## 2024-09-25 ENCOUNTER — Ambulatory Visit (INDEPENDENT_AMBULATORY_CARE_PROVIDER_SITE_OTHER): Admitting: Physician Assistant

## 2024-09-25 VITALS — BP 96/65 | HR 80 | Ht 61.0 in | Wt 105.0 lb

## 2024-09-25 DIAGNOSIS — N201 Calculus of ureter: Secondary | ICD-10-CM | POA: Diagnosis not present

## 2024-09-25 DIAGNOSIS — R300 Dysuria: Secondary | ICD-10-CM

## 2024-09-25 LAB — URINALYSIS, COMPLETE
Bilirubin, UA: NEGATIVE
Glucose, UA: NEGATIVE
Ketones, UA: NEGATIVE
Leukocytes,UA: NEGATIVE
Nitrite, UA: NEGATIVE
Protein,UA: NEGATIVE
Specific Gravity, UA: 1.02 (ref 1.005–1.030)
Urobilinogen, Ur: 0.2 mg/dL (ref 0.2–1.0)
pH, UA: 6 (ref 5.0–7.5)

## 2024-09-25 LAB — BLADDER SCAN AMB NON-IMAGING

## 2024-09-25 LAB — MICROSCOPIC EXAMINATION: Epithelial Cells (non renal): >10 /HPF — AB (ref 0–10)

## 2024-09-25 NOTE — Progress Notes (Signed)
 09/25/2024 11:21 AM   Tonya Miller 10/09/1966 [id number]  CC: Chief Complaint  Patient presents with   Follow-up   HPI: Tonya Miller is a 58 y.o. female with PMH CAD with NSTEMI, COPD, subarachnoid hemorrhage, OSA, and nephrolithiasis who underwent left ureteroscopy with laser lithotripsy and stent exchange with Dr. Georganne on 08/20/2024 for management of two stacked 3-5 mm mid left ureteral stones who presents today for postop follow-up.   Renal ultrasound dated 09/18/2024 showed no hydronephrosis and a distended prevoid bladder volume calculated at 509 mL.  She called us  2 weeks ago to report dysuria, nausea, and flank pain without fever or hematuria.  She declined an office visit due to co-pay concerns.  IntraOp Stone analysis resulted with 70% calcium  oxalate monohydrate, 30% calcium  phosphate.  Today she reports dysuria, urgency, and flank pain have progressively improved after she removed her stent at home as instructed.  She denies any vaginal itching or concern for yeast infections.  In-office UA today positive for trace intact blood; urine microscopy with 3-10 RBCs/HPF, >10 epithelial cells/hpf, and yeast.  PVR 0mL.  PMH: Past Medical History:  Diagnosis Date   Anxiety    Aortic atherosclerosis    Bilateral carotid artery disease    COPD (chronic obstructive pulmonary disease) (HCC)    Coronary artery disease    a.) NSTEMI 07/28/2021: IRA 70% pRCA (3.0 x 18 mm Onyx Frontier DES) and 80% mRCA (2.5 x 15 mm Onyx Frontier DES)   DDD (degenerative disc disease), cervical    Dyspnea    History of 2019 novel coronavirus disease (COVID-19)    History of kidney stones    HLD (hyperlipidemia)    HTN (hypertension)    Long term current use of clopidogrel     Marijuana use    Muscle spasm    NSTEMI (non-ST elevated myocardial infarction) (HCC) 07/28/2021   a.) troponin peaked at 1609 ng/L; b.) LHC/PCI 07/28/2021: 70% pRCA (3.0 x 18 mm Onyx Frontier DES), 80% mRCA (2.5 x 15 mm  Onyx Frontier DES), 70% dLCx, 50% p-mLAD   Pulmonary nodule    SAH (subarachnoid hemorrhage) (HCC) 09/05/2017   a.) CT head 09/05/2017: tiny focus of acute subarachnoid hemorrhage at lateral RIGHT frontal region   Scoliosis    Sepsis secondary to UTI (HCC) 07/26/2024   Sleep apnea    a.) does not require nocturnal PAP therapy   Tobacco abuse    Vitamin D deficiency     Surgical History: Past Surgical History:  Procedure Laterality Date   CESAREAN SECTION     CORONARY STENT INTERVENTION N/A 07/28/2021   Procedure: CORONARY STENT INTERVENTION;  Surgeon: Wonda Sharper, MD;  Location: [facility] INVASIVE CV LAB;  Service: Cardiovascular;  Laterality: N/A;   CYSTOSCOPY WITH STENT PLACEMENT Left 07/27/2024   Procedure: CYSTOSCOPY, WITH STENT INSERTION;  Surgeon: Renda Glance, MD;  Location: [facility] ORS;  Service: Urology;  Laterality: Left;   CYSTOSCOPY/URETEROSCOPY/HOLMIUM LASER/STENT PLACEMENT Left 08/20/2024   Procedure: CYSTOSCOPY/URETEROSCOPY/HOLMIUM LASER/STENT EXCHANGE;  Surgeon: Georganne Penne SAUNDERS, MD;  Location: [facility] ORS;  Service: Urology;  Laterality: Left;   KYPHOPLASTY N/A    LEFT HEART CATH AND CORONARY ANGIOGRAPHY N/A 07/28/2021   Procedure: LEFT HEART CATH AND CORONARY ANGIOGRAPHY;  Surgeon: Wonda Sharper, MD;  Location: [facility] INVASIVE CV LAB;  Service: Cardiovascular;  Laterality: N/A;    Home Medications:  Allergies as of 09/25/2024   No Known Allergies      Medication List        Accurate as  of September 25, 2024 11:21 AM. If you have any questions, ask your nurse or doctor.          acetaminophen  500 MG tablet Commonly known as: TYLENOL  Take 1,000 mg by mouth every 6 (six) hours as needed for moderate pain.   acetaminophen  500 MG tablet Commonly known as: TYLENOL  Take 2 tablets (1,000 mg total) by mouth every 6 (six) hours as needed.   atorvastatin  80 MG tablet Commonly known as: LIPITOR  Take 80 mg by mouth daily.   busPIRone  10 MG tablet Commonly known as:  BUSPAR  Take 10 mg by mouth 2 (two) times daily as needed.   ciprofloxacin  500 MG tablet Commonly known as: Cipro  Take 1 tablet by mouth about 1 hour prior to your ureteral stent removal   clopidogrel  75 MG tablet Commonly known as: PLAVIX  Take 75 mg by mouth daily.   ibuprofen  200 MG tablet Commonly known as: Motrin  IB Take 3 tablets (600 mg total) by mouth every 8 (eight) hours as needed.   methocarbamol  500 MG tablet Commonly known as: ROBAXIN  Take 1 tablet (500 mg total) by mouth every 8 (eight) hours as needed for muscle spasms.   ondansetron  4 MG disintegrating tablet Commonly known as: ZOFRAN -ODT Take 1 tablet (4 mg total) by mouth every 8 (eight) hours as needed for nausea or vomiting.   oxybutynin  10 MG 24 hr tablet Commonly known as: DITROPAN -XL Take 1 tablet (10 mg total) by mouth daily.   oxyCODONE  5 MG immediate release tablet Commonly known as: Roxicodone  Take 1 tablet (5 mg total) by mouth every 8 (eight) hours as needed for breakthrough pain.   oxyCODONE -acetaminophen  7.5-325 MG tablet Commonly known as: PERCOCET Take 1 tablet by mouth every 4 (four) hours as needed for severe pain (pain score 7-10).   tamsulosin  0.4 MG Caps capsule Commonly known as: FLOMAX  Take 1 capsule (0.4 mg total) by mouth daily.   tizanidine 2 MG capsule Commonly known as: ZANAFLEX Take 2 mg by mouth 3 (three) times daily as needed for muscle spasms.        Allergies:  No Known Allergies  Family History: Family History  Problem Relation Age of Onset   Heart failure Mother    Heart disease Brother     Social History:   reports that she has been smoking cigarettes. She has never used smokeless tobacco. She reports current drug use. Drug: Marijuana. She reports that she does not drink alcohol.  Physical Exam: BP 96/65   Pulse 80   Ht 5' 1 (1.549 m)   Wt 105 lb (47.6 kg)   LMP  (LMP Unknown)   SpO2 98%   BMI 19.84 kg/m   Constitutional:  Alert and oriented, no  acute distress, nontoxic appearing HEENT: [place], AT Cardiovascular: No clubbing, cyanosis, or edema Respiratory: Normal respiratory effort, no increased work of breathing Skin: No rashes, bruises or suspicious lesions Neurologic: Grossly intact, no focal deficits, moving all 4 extremities Psychiatric: Normal mood and affect  Laboratory Data: Results for orders placed or performed in visit on 09/25/24  Microscopic Examination   Collection Time: 09/25/24 11:28 AM   Urine  Result Value Ref Range   WBC, UA 0-5 0 - 5 /hpf   RBC, Urine 3-10 (A) 0 - 2 /hpf   Epithelial Cells (non renal) >10 (A) 0 - 10 /hpf   Bacteria, UA Few None seen/Few   Yeast, UA Present (A) None seen  Urinalysis, Complete   Collection Time: 09/25/24 11:28 AM  Result Value Ref Range   Specific Gravity, UA 1.020 1.005 - 1.030   pH, UA 6.0 5.0 - 7.5   Color, UA Yellow Yellow   Appearance Ur Clear Clear   Leukocytes,UA Negative Negative   Protein,UA Negative Negative/Trace   Glucose, UA Negative Negative   Ketones, UA Negative Negative   RBC, UA Trace (A) Negative   Bilirubin, UA Negative Negative   Urobilinogen, Ur 0.2 0.2 - 1.0 mg/dL   Nitrite, UA Negative Negative   Microscopic Examination See below:   BLADDER SCAN AMB NON-IMAGING   Collection Time: 09/25/24 11:29 AM  Result Value Ref Range   Scan Result 0ml    Pertinent Imaging: Results for orders placed during the hospital encounter of 09/18/24  Ultrasound renal complete  Narrative EXAM: US  Retroperitoneum Complete, Renal. 09/18/2024 03:01:21 PM  TECHNIQUE: Real-time ultrasonography of the retroperitoneum renal was performed.  COMPARISON: CT with intravenous contrast 07/28/2024.  CLINICAL HISTORY: Left ureteral stone.  FINDINGS:  FINDINGS: RIGHT KIDNEY/URETER: Right kidney measures 9.2 x 4.7 x 4.6 cm. Normal cortical echogenicity.  Cortical column of Bertin, representing normal variant anatomy, insinuating into the interpolar renal sinus.  This has been stable on old CTs back to 2018. No hydronephrosis. No calculus. No mass.  LEFT KIDNEY/URETER: Left kidney measures 9.4 x 5.0 x 4.1 cm. Normal cortical echogenicity. No hydronephrosis. No calculus. No mass.  BLADDER: Unremarkable appearance of the bladder. Bilateral ureteral jets are confirmed on exam. Bladder volume calculates 509 ml. Post void residual was not assessed.  IMPRESSION: 1. No ultrasound evidence of left ureteral obstruction; bilateral ureteral jets are present and bladder appears unremarkable. 2. No calyceal stones are identified by ultrasound bilaterally. 3. Normal bilateral renal cortical thickness and echogenicity.  Electronically signed by: Francis Quam MD 09/25/2024 01:36 AM EST RP Workstation: [id number]  I personally reviewed the images referenced above and note no hydronephrosis or shadowing stones.  Assessment & Plan:   1. Left ureteral stone (Primary) No hydronephrosis or shadowing stones on postop ultrasound.  She prefers to follow-up with us  as needed.  I sent her my stone prevention recommendations via MyChart.  2. Dysuria UA bland, though contaminated.  She is emptying appropriately.  Anticipate her irritative symptoms will be time-limited. - Urinalysis, Complete - BLADDER SCAN AMB NON-IMAGING   Return if symptoms worsen or fail to improve.  Lucie Hones, PA-C  [facility] [place] [address], [address] [place], [place] [address] [phone]

## 2024-09-25 NOTE — Patient Instructions (Signed)
 SABRA
